# Patient Record
Sex: Female | Born: 1985 | Race: White | Hispanic: No | Smoking: Former smoker
Health system: Southern US, Community
[De-identification: ages and names within clinical notes are randomized; demographics above are authoritative.]

## PROBLEM LIST (undated history)

## (undated) DIAGNOSIS — F32A Depression, unspecified: Secondary | ICD-10-CM

## (undated) DIAGNOSIS — F319 Bipolar disorder, unspecified: Secondary | ICD-10-CM

## (undated) DIAGNOSIS — F419 Anxiety disorder, unspecified: Secondary | ICD-10-CM

## (undated) DIAGNOSIS — F909 Attention-deficit hyperactivity disorder, unspecified type: Secondary | ICD-10-CM

## (undated) DIAGNOSIS — F4310 Post-traumatic stress disorder, unspecified: Secondary | ICD-10-CM

## (undated) DIAGNOSIS — F410 Panic disorder [episodic paroxysmal anxiety] without agoraphobia: Secondary | ICD-10-CM

## (undated) DIAGNOSIS — O139 Gestational [pregnancy-induced] hypertension without significant proteinuria, unspecified trimester: Secondary | ICD-10-CM

## (undated) DIAGNOSIS — F329 Major depressive disorder, single episode, unspecified: Secondary | ICD-10-CM

## (undated) HISTORY — DX: Attention-deficit hyperactivity disorder, unspecified type: F90.9

## (undated) HISTORY — DX: Bipolar disorder, unspecified: F31.9

## (undated) HISTORY — DX: Major depressive disorder, single episode, unspecified: F32.9

## (undated) HISTORY — DX: Panic disorder (episodic paroxysmal anxiety): F41.0

## (undated) HISTORY — PX: TYMPANOSTOMY TUBE PLACEMENT: SHX32

## (undated) HISTORY — DX: Post-traumatic stress disorder, unspecified: F43.10

## (undated) HISTORY — DX: Depression, unspecified: F32.A

---
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
## 2004-10-10 ENCOUNTER — Other Ambulatory Visit: Admission: RE | Admit: 2004-10-10 | Discharge: 2004-10-10 | Payer: Self-pay | Admitting: Obstetrics and Gynecology

## 2005-09-09 ENCOUNTER — Other Ambulatory Visit: Admission: RE | Admit: 2005-09-09 | Discharge: 2005-09-09 | Payer: Self-pay | Admitting: Obstetrics and Gynecology

## 2005-12-18 ENCOUNTER — Other Ambulatory Visit: Admission: RE | Admit: 2005-12-18 | Discharge: 2005-12-18 | Payer: Self-pay | Admitting: Obstetrics & Gynecology

## 2006-12-22 ENCOUNTER — Other Ambulatory Visit: Admission: RE | Admit: 2006-12-22 | Discharge: 2006-12-22 | Payer: Self-pay | Admitting: Obstetrics & Gynecology

## 2008-04-08 ENCOUNTER — Inpatient Hospital Stay (HOSPITAL_COMMUNITY): Admission: AD | Admit: 2008-04-08 | Discharge: 2008-04-10 | Payer: Self-pay | Admitting: Obstetrics and Gynecology

## 2009-01-02 ENCOUNTER — Emergency Department (HOSPITAL_COMMUNITY): Admission: EM | Admit: 2009-01-02 | Discharge: 2009-01-02 | Payer: Self-pay | Admitting: Family Medicine

## 2009-06-13 ENCOUNTER — Emergency Department (HOSPITAL_COMMUNITY): Admission: EM | Admit: 2009-06-13 | Discharge: 2009-06-13 | Payer: Self-pay | Admitting: Emergency Medicine

## 2010-12-01 NOTE — L&D Delivery Note (Signed)
  Vanessa Mccarty, Vanessa Mccarty [[id number]]  Delivery Note At 1:30 PM a healthy female was delivered via Vaginal, Spontaneous Delivery (Presentation: LOA;  ).  APGAR: 9, 9; weight 8 lb 12.4 oz (3980 g).   Placenta delivered spontaneously ,nuchal cord x 1 reduced over head.  Anesthesia: Epidural  Episiotomy: none Lacerations: 1st degree Suture Repair: 3.0 vicryl rapide Est. Blood Loss (mL): 400cc Multiple skin tags and probable condylomas removed sharply per pt request and base treated wih silver nitrate for bleeding Sent to path.  Mom to postpartum.  Baby to nursery-stable.  [name] 07/10/2011, 1:55 PM

## 2010-12-05 ENCOUNTER — Ambulatory Visit (HOSPITAL_COMMUNITY)
Admission: RE | Admit: 2010-12-05 | Discharge: 2010-12-05 | Payer: Self-pay | Source: Home / Self Care | Attending: Obstetrics and Gynecology | Admitting: Obstetrics and Gynecology

## 2010-12-09 LAB — RPR: RPR: NONREACTIVE

## 2010-12-20 LAB — TYPE AND SCREEN: Antibody Screen: NEGATIVE

## 2010-12-20 LAB — ABO/RH

## 2011-03-09 LAB — COMPREHENSIVE METABOLIC PANEL
ALT: 17 U/L (ref 0–35)
Alkaline Phosphatase: 62 U/L (ref 39–117)
BUN: 15 mg/dL (ref 6–23)
CO2: 23 mEq/L (ref 19–32)
Calcium: 9.4 mg/dL (ref 8.4–10.5)
GFR calc non Af Amer: >60 mL/min (ref >60)
Glucose, Bld: 145 mg/dL — ABNORMAL HIGH (ref 70–99)
Sodium: 141 mEq/L (ref 135–145)
Total Protein: 7.7 g/dL (ref 6.0–8.3)

## 2011-03-09 LAB — POCT PREGNANCY, URINE: Preg Test, Ur: NEGATIVE

## 2011-03-09 LAB — CBC
HCT: 45.3 % (ref 36.0–46.0)
Hemoglobin: 15.7 g/dL — ABNORMAL HIGH (ref 12.0–15.0)
MCHC: 34.7 g/dL (ref 30.0–36.0)
RBC: 4.98 MIL/uL (ref 3.87–5.11)
RDW: 12.4 % (ref 11.5–15.5)

## 2011-03-09 LAB — LIPASE, BLOOD: Lipase: 44 U/L (ref 11–59)

## 2011-03-09 LAB — URINALYSIS, ROUTINE W REFLEX MICROSCOPIC
Ketones, ur: 15 mg/dL — AB
Nitrite: NEGATIVE
Protein, ur: 30 mg/dL — AB
Urobilinogen, UA: 1 mg/dL (ref 0.0–1.0)

## 2011-03-09 LAB — DIFFERENTIAL
Basophils Relative: 0 % (ref 0–1)
Eosinophils Absolute: 0 10*3/uL (ref 0.0–0.7)
Neutro Abs: 16.2 10*3/uL — ABNORMAL HIGH (ref 1.7–7.7)
Neutrophils Relative %: 93 % — ABNORMAL HIGH (ref 43–77)

## 2011-03-09 LAB — URINE MICROSCOPIC-ADD ON

## 2011-03-18 LAB — POCT URINALYSIS DIP (DEVICE)
Nitrite: NEGATIVE
Protein, ur: 100 mg/dL — AB
Urobilinogen, UA: 0.2 mg/dL (ref 0.0–1.0)
pH: 6.5 (ref 5.0–8.0)

## 2011-03-18 LAB — POCT PREGNANCY, URINE: Preg Test, Ur: NEGATIVE

## 2011-03-18 LAB — URINE CULTURE

## 2011-04-15 NOTE — Discharge Summary (Signed)
Vanessa Mccarty, TINDOL             ACCOUNT NO.:  [id number]   MEDICAL RECORD NO.:  [id number]          PATIENT TYPE:  INP   LOCATION:  9130                          FACILITY:  [facility]   PHYSICIAN:  Malachi Pro. Ambrose Mantle, M.D. DATE OF BIRTH:  08-Jul-1986   DATE OF ADMISSION:  04/08/2008  DATE OF DISCHARGE:  04/10/2008                               DISCHARGE SUMMARY   A 25 year old white, married female, para 0-0-1-0, gravida 2, EDC Apr 16, 2008, admitted with premature rupture of the membranes.  Blood group  and type O positive, negative antibody, nonreactive serology, rubella  immune, hepatitis B surface antigen negative, HIV negative, GC and  chlamydia negative, AFP negative, 1-hour Glucola within normal limits  per the patient's report.  Group B strep negative.  Cystic fibrosis  negative.  First trimester screen negative.  Vaginal ultrasound on  September 16, 2007 crown-rump length 2.7 cm, 9 weeks 4 days, [facility] Apr 16, 2008.  Ultrasound on November 23, 2007, average gestational age [redacted] weeks  6 days, [facility] Apr 12, 2008.  Prenatal care was uncomplicated.  At  approximately 1 a.m. on the day of admission, the patient had  spontaneous rupture of membranes at home with clear fluid.  She came to  the maternity admission unit, Pitocin was begun after she was admitted.   PAST MEDICAL HISTORY:  Allergies, possibly to SULFA, questionable  reaction.   ILLNESSES:  Urinary tract infections, anxiety, depression, and  migraines.   OPERATIONS:  Ear surgeries.   FAMILY HISTORY:  Mother with high blood pressure, paternal grandfather  with leukemia, maternal grandfather with diabetes, and two cousins with  mental retardation.   OBSTETRICAL HISTORY:  In 2006, a spontaneous abortion that was not  confirmed.   On admission, the patient's blood pressures were borderline at 151/91,  141/79, 139/92, pulse 76, respirations 20, and temperature 98.2.  Heart  and lungs were normal.  The abdomen was soft.  Fundal  height was  consistent with dates.  Fetal heart tones were normal.  Contractions  were every 2 minutes on 6 mU/min of Pitocin.  Cervix was 3 cm, 80%  vertex at a -2.  In spite of the fact that the patient had ruptured  membranes, it had been confirmed she still had a forebag and this was  ruptured producing a slight amount of fluid.  After the rupture of  membranes, the patient became very uncomfortable.  She made progress and  received an epidural.  After full dilatation, she pushed well and showed  approximately 7 cm of caput, but at that point, she made no more  progress and requested assistance.  With the Mushroom self-controlled  vacuum applied with one contraction and no pop offs, a living female  infant 8 pounds 2 ounces with Apgars of 9 at one and 9 at five minutes  was delivered OA over an intact perineum with bilateral vaginal and  labial lacerations by Dr. Ambrose Mantle.  Placenta was intact.  There was one  loop of nuchal cord.  Bilateral lacerations were sutured with 3-0 Vicryl  under local block.  Blood loss was  about 400 mL.  Postpartum, the  patient's mother who has had extensive experience as a nurse and had a  relative who apparently had severe problem with preeclampsia, requested  the patient go on magnesium sulfate.  I informed the patient that she  had PIH, but not preeclampsia, but would be glad to treat her with mag  sulfate if that is what the family requested.  She was begun on  magnesium sulfate in spite of the fact that urine had no protein and her  platelet count and liver function tests were normal.  On the morning of  the first postpartum day, the patient was afebrile.  Her blood pressures  had improved.  She had no headache.  PIH labs were again normal.  There  was significant bulbar edema bilaterally with some ecchymosis.  On the  second postpartum day, the patient had been off magnesium sulfate for  approximately 16 hours.  Her blood pressures have remained less  than  150/100.  She had no headache or epigastric pain and she was considered  a candidate for discharge.   LABORATORY DATA:  Initial hemoglobin was 12.6, hematocrit 36.4, white  count 20,100, and platelet count 213,000.  Followup hemoglobin 11.6 and  platelet count 225,000.  Potassium was 3.5 and 3.3, BUN was 3 and less  than 1.  SGOT and SGPT were 20 and 10 on the first day and 26 and 12 on  the second day.  LDH was 173 and 262, thought to be representative of  hemolysis.  Uric acids were 4.7 and 4.8.  Cath urine protein was  negative.  RPR was nonreactive.   FINAL DIAGNOSES:  Intrauterine pregnancy at 38 weeks and 6 days,  delivered OA with vacuum assistance and pregnancy-induced high blood  pressure.   OPERATION:  Vacuum-assisted vaginal delivery OA, repair of bilateral  labial and vaginal lacerations.   FINAL CONDITION:  Improved.   DISCHARGE INSTRUCTIONS:  Instructions include our regular discharge  instruction booklet.  Percocet 5/325, 36 tablets one every 4-6 hours as  needed for pain.  The patient is advised to avoid nonsteroidal anti-  inflammatory drugs.  She is advised to take her blood pressure twice  daily.  If the blood pressure is above 150/100 or if she begins to have  severe headache or epigastric pain, she is to call our office.  She is  to return in 2 weeks for followup examination.      Malachi Pro. Ambrose Mantle, M.D.  Electronically Signed     TFH/MEDQ  D:  04/10/2008  T:  04/10/2008  Job:  [id number]

## 2011-06-22 ENCOUNTER — Encounter (HOSPITAL_COMMUNITY): Payer: Self-pay | Admitting: *Deleted

## 2011-06-22 ENCOUNTER — Inpatient Hospital Stay (HOSPITAL_COMMUNITY)
Admission: AD | Admit: 2011-06-22 | Discharge: 2011-06-22 | Disposition: A | Discharge status: Home / Self Care | Payer: PRIVATE HEALTH INSURANCE | Source: Ambulatory Visit | Attending: Obstetrics and Gynecology | Admitting: Obstetrics and Gynecology

## 2011-06-22 DIAGNOSIS — O4700 False labor before 37 completed weeks of gestation, unspecified trimester: Secondary | ICD-10-CM | POA: Insufficient documentation

## 2011-06-22 HISTORY — DX: Gestational (pregnancy-induced) hypertension without significant proteinuria, unspecified trimester: O13.9

## 2011-06-22 NOTE — Progress Notes (Signed)
Notified of no change in SVE. Orders for discharge home. F/u in office this week

## 2011-06-22 NOTE — Progress Notes (Signed)
"  At 0300, I leaked clear fluid when I woke up to use the BR.  When I got up to use the BR I was already wet, and I still had to pee so I continued to go to the BR.  I haven't leaked any more since then, but I started having at 0320 every 3-6 mins.

## 2011-06-22 NOTE — ED Notes (Signed)
Report received from [facility] agree with initial assessment.

## 2011-06-22 NOTE — Progress Notes (Signed)
G2P1 at 37wks. Awoke at 0300 and panties wet but denies leaking fld since then. No bleeding. Contractions since 0320

## 2011-07-09 ENCOUNTER — Other Ambulatory Visit: Payer: Self-pay | Admitting: Obstetrics and Gynecology

## 2011-07-10 ENCOUNTER — Other Ambulatory Visit: Payer: Self-pay | Admitting: Obstetrics and Gynecology

## 2011-07-10 ENCOUNTER — Inpatient Hospital Stay (HOSPITAL_COMMUNITY): Payer: PRIVATE HEALTH INSURANCE | Admitting: Anesthesiology

## 2011-07-10 ENCOUNTER — Encounter (HOSPITAL_COMMUNITY): Payer: Self-pay | Admitting: Anesthesiology

## 2011-07-10 ENCOUNTER — Inpatient Hospital Stay (HOSPITAL_COMMUNITY)
Admission: RE | Admit: 2011-07-10 | Discharge: 2011-07-12 | DRG: 774 | Disposition: A | Discharge status: Home / Self Care | Payer: PRIVATE HEALTH INSURANCE | Source: Ambulatory Visit | Attending: Obstetrics and Gynecology | Admitting: Obstetrics and Gynecology

## 2011-07-10 ENCOUNTER — Encounter (HOSPITAL_COMMUNITY): Payer: Self-pay

## 2011-07-10 DIAGNOSIS — L909 Atrophic disorder of skin, unspecified: Secondary | ICD-10-CM | POA: Diagnosis present

## 2011-07-10 DIAGNOSIS — O139 Gestational [pregnancy-induced] hypertension without significant proteinuria, unspecified trimester: Secondary | ICD-10-CM | POA: Diagnosis present

## 2011-07-10 DIAGNOSIS — O98519 Other viral diseases complicating pregnancy, unspecified trimester: Secondary | ICD-10-CM | POA: Diagnosis present

## 2011-07-10 DIAGNOSIS — A630 Anogenital (venereal) warts: Secondary | ICD-10-CM | POA: Diagnosis present

## 2011-07-10 HISTORY — DX: Anxiety disorder, unspecified: F41.9

## 2011-07-10 LAB — CBC
HCT: 34.6 % — ABNORMAL LOW (ref 36.0–46.0)
Hemoglobin: 11.6 g/dL — ABNORMAL LOW (ref 12.0–15.0)
MCH: 30.9 pg (ref 26.0–34.0)
MCHC: 34.5 g/dL (ref 30.0–36.0)
Platelets: 181 10*3/uL (ref 150–400)
Platelets: 167 10*3/uL (ref 150–400)
RBC: 3.76 MIL/uL — ABNORMAL LOW (ref 3.87–5.11)
RDW: 13.5 % (ref 11.5–15.5)
WBC: 19.5 10*3/uL — ABNORMAL HIGH (ref 4.0–10.5)

## 2011-07-10 LAB — URINALYSIS, DIPSTICK ONLY
Glucose, UA: NEGATIVE mg/dL
Leukocytes, UA: NEGATIVE
Nitrite: NEGATIVE
Protein, ur: NEGATIVE mg/dL
Urobilinogen, UA: 0.2 mg/dL (ref 0.0–1.0)

## 2011-07-10 LAB — COMPREHENSIVE METABOLIC PANEL
ALT: 8 U/L (ref 0–35)
AST: 20 U/L (ref 0–37)
Albumin: 2.7 g/dL — ABNORMAL LOW (ref 3.5–5.2)
Alkaline Phosphatase: 257 U/L — ABNORMAL HIGH (ref 39–117)
BUN: <3 mg/dL — ABNORMAL LOW (ref 6–23)
Chloride: 102 mEq/L (ref 96–112)
Potassium: 3.1 mEq/L — ABNORMAL LOW (ref 3.5–5.1)
Sodium: 134 mEq/L — ABNORMAL LOW (ref 135–145)
Total Bilirubin: 0.6 mg/dL (ref 0.3–1.2)

## 2011-07-10 LAB — RPR: RPR Ser Ql: NONREACTIVE

## 2011-07-10 MED ORDER — DIPHENHYDRAMINE HCL 50 MG/ML IJ SOLN: 12.5000 mg | INTRAMUSCULAR | Status: DC | PRN

## 2011-07-10 MED ORDER — OXYTOCIN 20 UNITS IN LACTATED RINGERS INFUSION - SIMPLE: 125.0000 mL/h | INTRAVENOUS | Status: DC

## 2011-07-10 MED ORDER — OXYTOCIN 20 UNITS IN LACTATED RINGERS INFUSION - SIMPLE
1.0000 m[IU]/min | INTRAVENOUS | Status: DC
  Administered 2011-07-10: 2 m[IU]/min via INTRAVENOUS
  Administered 2011-07-10: 6 m[IU]/min via INTRAVENOUS
  Administered 2011-07-10: 4 m[IU]/min via INTRAVENOUS

## 2011-07-10 MED ORDER — EPHEDRINE 5 MG/ML INJ
10.0000 mg | INTRAVENOUS | Status: DC | PRN
  Filled 2011-07-10: qty 4

## 2011-07-10 MED ORDER — CITRIC ACID-SODIUM CITRATE 334-500 MG/5ML PO SOLN: 30.0000 mL | ORAL | Status: DC | PRN

## 2011-07-10 MED ORDER — OXYTOCIN BOLUS FROM INFUSION
500.0000 mL | Freq: Once | INTRAVENOUS | Status: DC
  Filled 2011-07-10: qty 500
  Filled 2011-07-10: qty 1000

## 2011-07-10 MED ORDER — ONDANSETRON HCL 4 MG/2ML IJ SOLN: 4.0000 mg | Freq: Four times a day (QID) | INTRAMUSCULAR | Status: DC | PRN

## 2011-07-10 MED ORDER — PRENATAL PLUS 27-1 MG PO TABS
1.0000 | ORAL_TABLET | Freq: Every day | ORAL | Status: DC
  Administered 2011-07-11 – 2011-07-12 (×2): 1 via ORAL
  Filled 2011-07-10 (×2): qty 1

## 2011-07-10 MED ORDER — LACTATED RINGERS IV SOLN
500.0000 mL | INTRAVENOUS | Status: DC | PRN
Start: 2011-07-10 — End: 2011-07-10

## 2011-07-10 MED ORDER — TERBUTALINE SULFATE 1 MG/ML IJ SOLN: 0.2500 mg | Freq: Once | INTRAMUSCULAR | Status: DC | PRN

## 2011-07-10 MED ORDER — WITCH HAZEL-GLYCERIN EX PADS
1.0000 "application " | MEDICATED_PAD | CUTANEOUS | Status: DC | PRN
  Administered 2011-07-11: 1 via TOPICAL

## 2011-07-10 MED ORDER — SIMETHICONE 80 MG PO CHEW: 80.0000 mg | CHEWABLE_TABLET | ORAL | Status: DC | PRN

## 2011-07-10 MED ORDER — IBUPROFEN 600 MG PO TABS: 600.0000 mg | ORAL_TABLET | Freq: Four times a day (QID) | ORAL | Status: DC | PRN

## 2011-07-10 MED ORDER — ZOLPIDEM TARTRATE 5 MG PO TABS: 5.0000 mg | ORAL_TABLET | Freq: Every evening | ORAL | Status: DC | PRN

## 2011-07-10 MED ORDER — LACTATED RINGERS IV SOLN: 500.0000 mL | INTRAVENOUS | Status: DC | PRN

## 2011-07-10 MED ORDER — OXYCODONE-ACETAMINOPHEN 5-325 MG PO TABS: 1.0000 | ORAL_TABLET | ORAL | Status: DC | PRN

## 2011-07-10 MED ORDER — PHENYLEPHRINE 40 MCG/ML (10ML) SYRINGE FOR IV PUSH (FOR BLOOD PRESSURE SUPPORT)
80.0000 ug | PREFILLED_SYRINGE | INTRAVENOUS | Status: DC | PRN
  Filled 2011-07-10 (×3): qty 5

## 2011-07-10 MED ORDER — LACTATED RINGERS IV SOLN: INTRAVENOUS | Status: DC

## 2011-07-10 MED ORDER — FLEET ENEMA 7-19 GM/118ML RE ENEM: 1.0000 | ENEMA | RECTAL | Status: DC | PRN

## 2011-07-10 MED ORDER — LIDOCAINE HCL (PF) 1 % IJ SOLN: 30.0000 mL | INTRAMUSCULAR | Status: DC | PRN

## 2011-07-10 MED ORDER — BENZOCAINE-MENTHOL 20-0.5 % EX AERO: 1.0000 "application " | INHALATION_SPRAY | CUTANEOUS | Status: DC | PRN

## 2011-07-10 MED ORDER — DIBUCAINE 1 % RE OINT: 1.0000 "application " | TOPICAL_OINTMENT | RECTAL | Status: DC | PRN

## 2011-07-10 MED ORDER — OXYCODONE-ACETAMINOPHEN 5-325 MG PO TABS: 2.0000 | ORAL_TABLET | ORAL | Status: DC | PRN

## 2011-07-10 MED ORDER — ACETAMINOPHEN 325 MG PO TABS: 650.0000 mg | ORAL_TABLET | ORAL | Status: DC | PRN

## 2011-07-10 MED ORDER — DIPHENHYDRAMINE HCL 25 MG PO CAPS: 25.0000 mg | ORAL_CAPSULE | Freq: Four times a day (QID) | ORAL | Status: DC | PRN

## 2011-07-10 MED ORDER — LIDOCAINE HCL (PF) 1 % IJ SOLN
30.0000 mL | INTRAMUSCULAR | Status: DC | PRN
  Filled 2011-07-10 (×2): qty 30

## 2011-07-10 MED ORDER — LANOLIN HYDROUS EX OINT: TOPICAL_OINTMENT | CUTANEOUS | Status: DC | PRN

## 2011-07-10 MED ORDER — FENTANYL 2.5 MCG/ML BUPIVACAINE 1/10 % EPIDURAL INFUSION (WH - ANES)
14.0000 mL/h | INTRAMUSCULAR | Status: DC
  Administered 2011-07-10: 14 mL/h via EPIDURAL
  Filled 2011-07-10 (×2): qty 60

## 2011-07-10 MED ORDER — EPHEDRINE 5 MG/ML INJ
10.0000 mg | INTRAVENOUS | Status: DC | PRN
  Filled 2011-07-10 (×3): qty 4

## 2011-07-10 MED ORDER — BENZOCAINE-MENTHOL 20-0.5 % EX AERO
INHALATION_SPRAY | CUTANEOUS | Status: AC
  Filled 2011-07-10: qty 56

## 2011-07-10 MED ORDER — PHENYLEPHRINE 40 MCG/ML (10ML) SYRINGE FOR IV PUSH (FOR BLOOD PRESSURE SUPPORT)
80.0000 ug | PREFILLED_SYRINGE | INTRAVENOUS | Status: DC | PRN
  Filled 2011-07-10: qty 5

## 2011-07-10 MED ORDER — SENNOSIDES-DOCUSATE SODIUM 8.6-50 MG PO TABS
2.0000 | ORAL_TABLET | Freq: Every day | ORAL | Status: DC
  Administered 2011-07-10 – 2011-07-11 (×2): 2 via ORAL

## 2011-07-10 MED ORDER — ONDANSETRON HCL 4 MG PO TABS: 4.0000 mg | ORAL_TABLET | ORAL | Status: DC | PRN

## 2011-07-10 MED ORDER — TETANUS-DIPHTH-ACELL PERTUSSIS 5-2.5-18.5 LF-MCG/0.5 IM SUSP: 0.5000 mL | Freq: Once | INTRAMUSCULAR | Status: DC

## 2011-07-10 MED ORDER — IBUPROFEN 600 MG PO TABS
600.0000 mg | ORAL_TABLET | Freq: Four times a day (QID) | ORAL | Status: DC
  Administered 2011-07-10 – 2011-07-12 (×8): 600 mg via ORAL
  Filled 2011-07-10 (×8): qty 1

## 2011-07-10 MED ORDER — ONDANSETRON HCL 4 MG/2ML IJ SOLN: 4.0000 mg | INTRAMUSCULAR | Status: DC | PRN

## 2011-07-10 MED ORDER — OXYTOCIN BOLUS FROM INFUSION: 500.0000 mL | Freq: Once | INTRAVENOUS | Status: DC

## 2011-07-10 MED ORDER — LACTATED RINGERS IV SOLN: 500.0000 mL | Freq: Once | INTRAVENOUS | Status: DC

## 2011-07-10 NOTE — H&P (Signed)
Vanessa Mccarty is a 25 y.o. female G3P1011 at 64 5/7 weeks (EDD 07/12/11 by 6 week [place])  presenting for induction of labor given term status and favorable cervix. Prenatal care essentially uncomplicated.  Occasional BP at 120/90, always normalized with rest.  No PIH sx or proteinuria. History Past ObHx SAB 2006 NSVD 2009 8#2oz  Past GynHX none  Past medical HX ADHD Anxiety/Depression--stable off meds for pregnancy (Zoloft in past) Migraines H/o pyelonephritis  Past Surgical History  Procedure Date  . Tympanostomy tube placement     tube fell out btwn 8-10 yrs and last one removed at 70yrs   Family History:in pt prenatals Social History:  reports that she quit smoking about 11 months ago. Her smoking use included Cigarettes. She has a 2.5 pack-year smoking history. She does not have any smokeless tobacco history on file. She reports that she drinks alcohol. She reports that she does not use illicit drugs.  Review of Systems  Gastrointestinal: Negative for heartburn.  Neurological: Negative for headaches.      Height 5\' 4"  (1.626 m), weight 69.854 kg (154 lb). Maternal Exam:  Uterine Assessment: Contraction strength is mild.  Contraction frequency is irregular.   Abdomen: Patient reports no abdominal tenderness. Fundal height is 39.   Estimated fetal weight is 8 1/2 lbs.   Fetal presentation: vertex  Introitus: Vulva is positive for lesion. Normal vagina.    Physical Exam  Constitutional: She appears well-developed.  Cardiovascular: Normal rate and regular rhythm.   Respiratory: Effort normal and breath sounds normal.  GI: Soft. Bowel sounds are normal.  Genitourinary: Vulva exhibits lesion.  Neurological: She is alert.  Psychiatric: Her behavior is normal.  --skin tags (multiple) and some possible HPV  Prenatal labs: ABO, Rh:  O positive Antibody: Negative (01/20 0000) Rubella:  Immune RPR: Nonreactive (01/09 0000)  HBsAg: Negative (01/09 0000)  HIV: Non-reactive  (08/09 0000)  GBS: Negative (07/09 0000)  First trimester screen WNL AFP WNL One hour glucola  126 GC-neg Chlam-neg Assessment/Plan: Pt admitted for induction of labor given term status and favorable cervix.  Some BP in the 90 diastolics, but no sx preeclampsia.  Will observe, and if increase throughout the day check PIH Labs and UA for protein.  On pitocin per protocol and plans epidural.   [name] 07/10/2011, 8:32 AM

## 2011-07-10 NOTE — Progress Notes (Signed)
Lactation brochure reviewed with mom, advised of community resources for breastfeeding mom, advised of outpatient services, if needed. Basic teaching reviewed.

## 2011-07-10 NOTE — Anesthesia Procedure Notes (Addendum)
Epidural Patient location during procedure: OB Start time: 07/10/2011 12:51 PM  Staffing Anesthesiologist: Jiles Garter  Preanesthetic Checklist Completed: patient identified, site marked, surgical consent, pre-op evaluation, timeout performed, IV checked, risks and benefits discussed and monitors and equipment checked  Epidural Patient position: sitting Prep: site prepped and draped and DuraPrep Patient monitoring: continuous pulse ox and blood pressure Approach: midline Injection technique: LOR air  Needle:  Needle type: Tuohy  Needle gauge: 17 G Needle length: 9 cm Needle insertion depth: 5 cm cm Catheter type: closed end flexible Catheter size: 19 Gauge Catheter at skin depth: 10 cm Test dose: negative  Assessment Events: blood not aspirated, injection not painful, no injection resistance, negative IV test and no paresthesia  Additional Notes Dosing of Epidural: 1st dose, Through needle...... 5mg  Marcaine 2nd dose, through catheter.... epi 1:200K + Xylocaine 40 mg 3rd dose, through catheter...Marland KitchenMarland Kitchenepi 1:200K + Xylocaine 60 mg Each dose occurred after waiting 3 min,patient was free of IV sx; and patient exhibits no evidence of SA injection  Patient is more comfortable after epidural dosed. Please see RN's note for documentation of vital signs,and FHR which are stable.

## 2011-07-10 NOTE — Progress Notes (Signed)
Post Partum Day0 Subjective: Feels well, no HA or pain  Objective: Blood pressure 143/97, pulse 92, temperature 98.1 F (36.7 C), temperature source Axillary, resp. rate 16, height 5\' 4"  (1.626 m), weight 69.854 kg (154 lb), SpO2 100.00%, unknown if currently breastfeeding.  Physical Exam:  General: alert Lochia: appropriate Uterine Fundus: firm     Basename 07/10/11 1534 07/10/11 0800  HGB 12.0 11.6*  HCT 34.6* 33.6*    Assessment/Plan: Pt with increased BP postpartum. 140/90's  Labs WNL and no proteinuria.  Will continue to follow closely.   LOS: 0 days   Porsche Noguchi W 07/10/2011, 6:50 PM

## 2011-07-10 NOTE — Anesthesia Preprocedure Evaluation (Signed)

## 2011-07-11 LAB — CBC
HCT: 32.7 % — ABNORMAL LOW (ref 36.0–46.0)
Platelets: 167 10*3/uL (ref 150–400)
RDW: 13.5 % (ref 11.5–15.5)
WBC: 15.2 10*3/uL — ABNORMAL HIGH (ref 4.0–10.5)

## 2011-07-11 NOTE — Progress Notes (Signed)
SW referral received to assess hx of depression.  Pt told SW that her depression symptoms have been treated off and on since she was 25 years old.  Pt described her depression as seasonal.  She has not taken medication (Zoloft) since 5/11.  She reports being able to cope well with out medication during pregnancy.  She has good family support.  She denies hx of SI.  Pt is aware of PP depression risk and plans to contact the doctor if symptoms arise.  Pt was appropriate and appreciative of PP depression literature given.  SW will assist further if needed. 

## 2011-07-11 NOTE — Anesthesia Postprocedure Evaluation (Signed)
  Anesthesia Post-op Note  Patient: Vanessa Mccarty  Procedure(s) Performed: * No procedures listed *  Patient Location: PACU and Mother/Baby  Anesthesia Type: Epidural  Level of Consciousness: awake, alert  and oriented  Airway and Oxygen Therapy: Patient Spontanous Breathing  Post-op Pain: none  Post-op Assessment: Post-op Vital signs reviewed  Post-op Vital Signs: Reviewed and stable  Complications: No apparent anesthesia complications

## 2011-07-11 NOTE — Progress Notes (Signed)
Post Partum Day1 Subjective: no complaints.  Pt feels no HA or PIH sx.  Nml lochia, pain controlled.  Objective: Blood pressure 165/95, pulse 74, temperature 98 F (36.7 C), temperature source Oral, resp. rate 18, height 5\' 4"  (1.626 m), weight 69.854 kg (154 lb), SpO2 98.00%, unknown if currently breastfeeding.  Physical Exam:  General: alert Lochia: appropriate Uterine Fundus: firm  PIH labs WNL last pm and urine negative for protein  Basename 07/11/11 0530 07/10/11 1534  HGB 11.5* 12.0  HCT 32.7* 34.6*    Assessment/Plan: Plan for discharge tomorrow Pt with elevated BP to 130-160/80-90's, but no evidence of preeclampsia.  Advised her if diastolics consistently in th 100's would Consider po meds.   LOS: 1 day   Vanessa Mccarty W 07/11/2011, 11:10 AM

## 2011-07-12 MED ORDER — LABETALOL HCL 100 MG PO TABS
100.0000 mg | ORAL_TABLET | Freq: Two times a day (BID) | ORAL | Status: DC
  Administered 2011-07-12: 100 mg via ORAL
  Filled 2011-07-12 (×3): qty 1

## 2011-07-12 MED ORDER — LABETALOL HCL 100 MG PO TABS: 100.0000 mg | ORAL_TABLET | Freq: Two times a day (BID) | ORAL | Status: DC

## 2011-07-12 MED ORDER — IBUPROFEN 600 MG PO TABS: 600.0000 mg | ORAL_TABLET | Freq: Four times a day (QID) | ORAL | Status: AC | PRN

## 2011-07-12 NOTE — Progress Notes (Signed)
Reviewed engorgement tx if needed. Per mom nipples alittle tender , shells and hand pump given , Reviewed basics with latching .

## 2011-07-12 NOTE — Discharge Summary (Signed)
NAMEDARSHANA, CURNUTT NO.:  [id number]  MEDICAL RECORD NO.:  [id number]  LOCATION:  9134                          FACILITY:  [facility]  PHYSICIAN:  Malachi Pro. Ambrose Mantle, M.D. DATE OF BIRTH:  1986/05/05  DATE OF ADMISSION:  07/10/2011 DATE OF DISCHARGE:  07/12/2011                              DISCHARGE SUMMARY   This is a 25 year old white female, para 1-0-1-1, gravida 3, EDC July 12, 2011 by 6-week ultrasound presented for induction of labor given her term status and favorable cervix.  Prenatal course was essentially uncomplicated.  Occasional blood pressure at 120/90, always normalized with rest, no PIH symptoms or proteinuria.  Blood group and type O+, negative antibody, rubella immune, RPR nonreactive, hepatitis B surface antigen negative, HIV negative.  Group B strep negative.  First trimester screen normal.  AFP normal, 1-hour Glucola 126, GC and Chlamydia negative.  After admission to the hospital, the patient was placed on Pitocin.  She progressed to full dilatation and delivered a spontaneous vaginal delivery, LOA, 8 pounds, 12.4 ounces infant with Apgars of 9 at 1 and 9 at 5 minutes.  Placenta was delivered intact. Nuchal cord x1 was reduced over the head.  Dr. Senaida Ores was in attendance.  Anesthesia was epidural.  No episiotomy.  First-degree laceration was sutured with 3-0 Vicryl Rapide.  Blood loss about 400 mL. Multiple skin tags and probable condyloma was removed sharply per the patient's request and base treated with silver nitrate for bleeding and sent to Pathology.  The patient's initial hemoglobin was 12.0, hematocrit 34.6, white count 19,500.  Followup hemoglobin was 11.5, hematocrit 32.7, both platelet count was 167,000.  Comprehensive metabolic profile showed SGOT and SGPT to be 20 and 8 respectively. Uric acid was 4.4.  The patient's postpartum course was complicated somewhat by elevated blood pressures.  As stated, the [facility] panel was normal as  well as a cath urine for protein, which was negative.  The patient continued to have somewhat elevated blood pressures and on the second postpartum day when I was ready to discharge, her blood pressure 162/113, so I began her on labetalol 100 mg twice a day and at this time, 4:30 p.m. on the second postpartum day, the patient's blood pressures have been 80s to low 90s diastolic and 140s to 150 systolic.  FINAL DIAGNOSES: 1. Intrauterine pregnancy at term, delivered LOA. 2. Gestational hypertension.  OPERATIONS: 1. Spontaneous delivery LOA. 2. Repair of first-degree laceration.  MEDICATIONS:  Include: 1. Advil 600 mg 1 tablet every 6 hours as needed for pain, 30 tablets     with one refill. 2. Labetalol 100 mg tablets 1 tablet twice a day, 60 tablets and one     refill.  The patient is advised to take her blood pressure a couple of times a day and report any blood pressure greater than 150/100.  She is to return to the office in 1 week for followup examination.     Malachi Pro. Ambrose Mantle, M.D.     TFH/MEDQ  D:  07/12/2011  T:  07/12/2011  Job:  [id number]

## 2011-07-12 NOTE — Progress Notes (Signed)
#  2 afebrile BP consistently elevated and this AM 162/113. Recheck 15 minutes later 156/82. Preeclampsia labs were normal  No sx Will begin labetolol 100 mg bid.

## 2011-07-12 NOTE — Progress Notes (Signed)
  BP stable and will discharge.

## 2011-12-24 IMAGING — US US OB COMP LESS 14 WK
1 series · 13 of 28 positions shown · non-contrast
Comparison: none

[Series 1: us ob comp less 14 wks · 28 acquisitions, 13 frames shown]
[im 2/28]
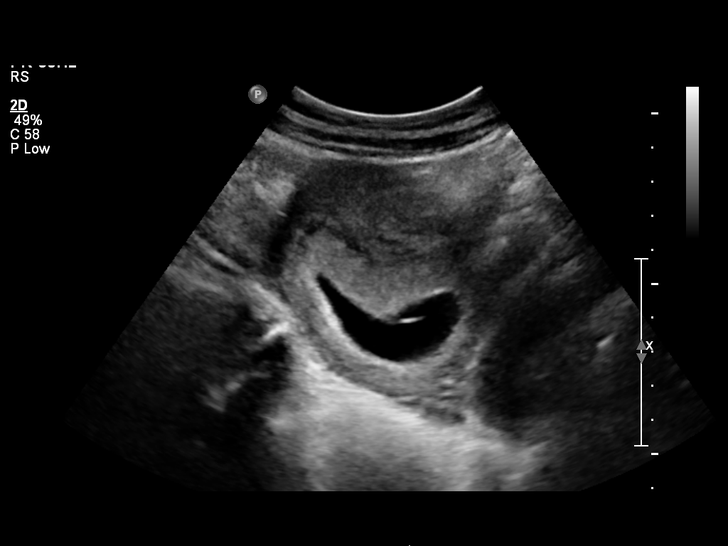
[im 4/28]
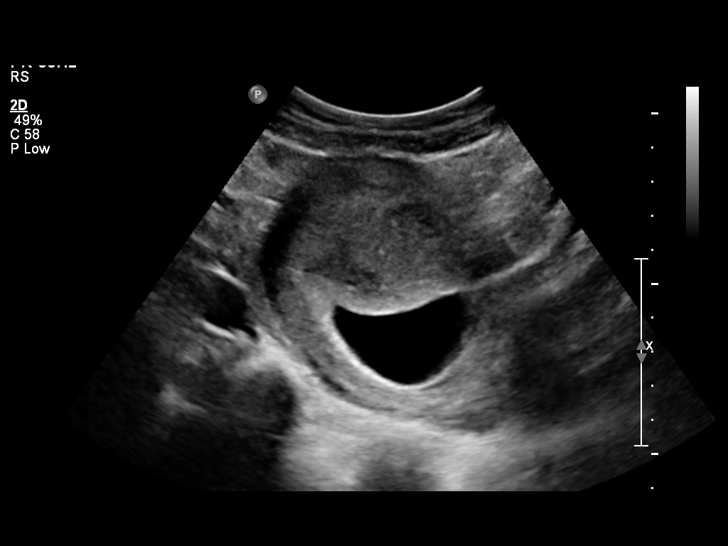
[im 6/28]
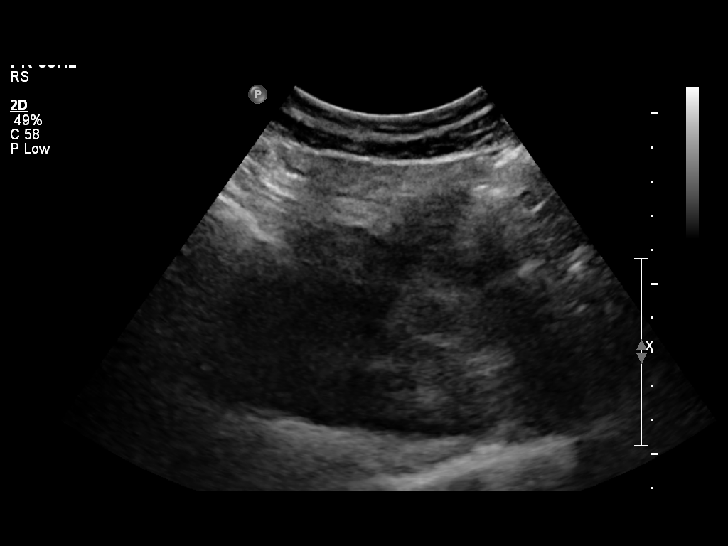
[im 8/28]
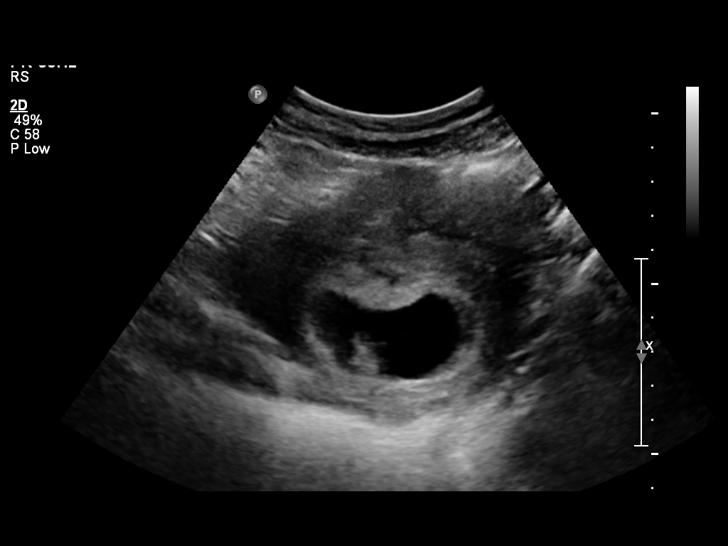
[im 10/28]
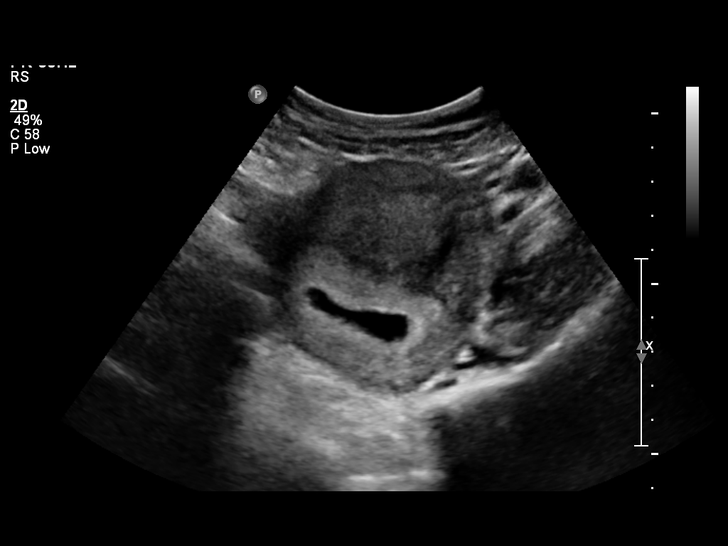
[im 12/28]
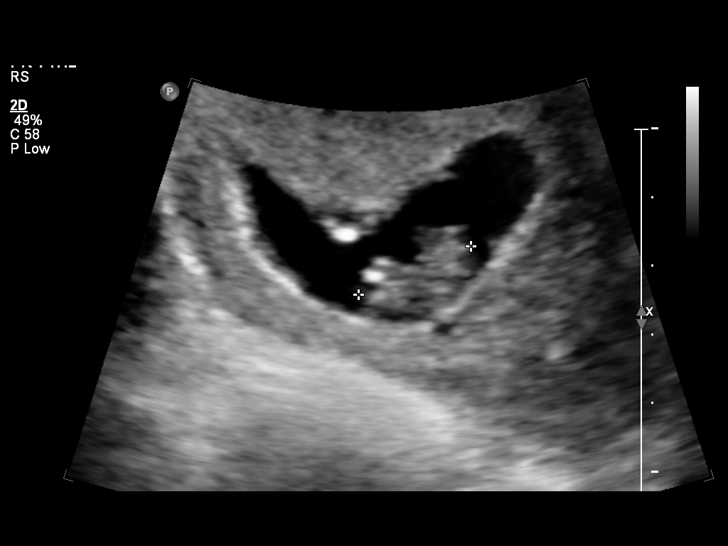
[im 15/28]
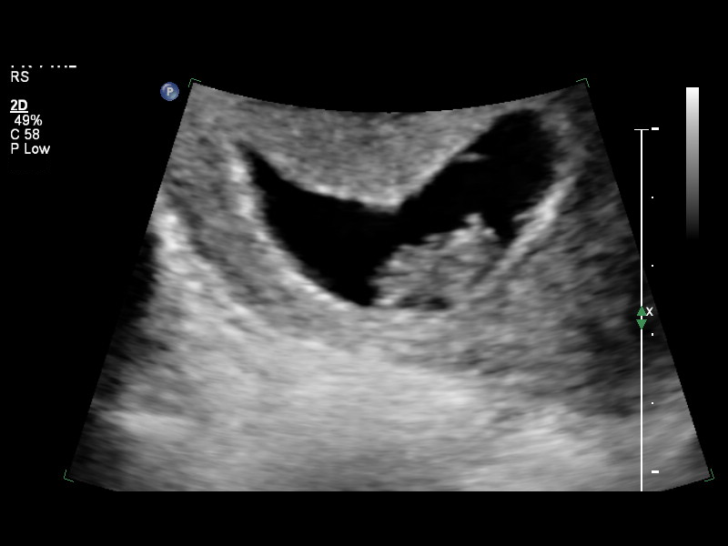
[im 17/28]
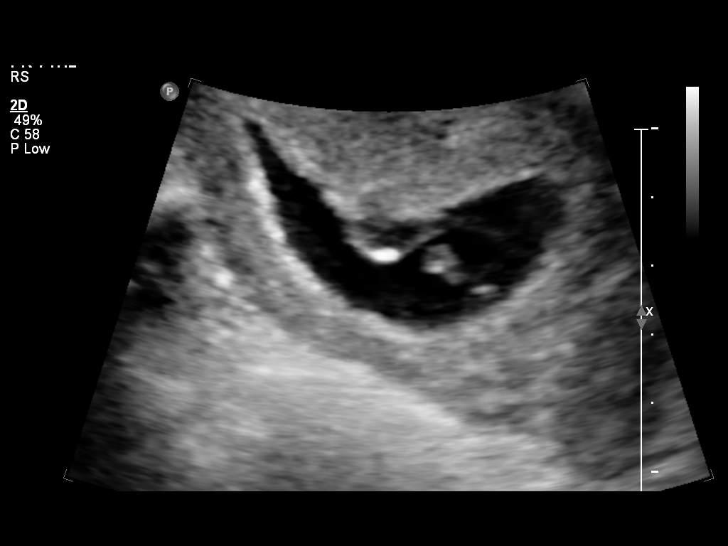
[im 19/28]
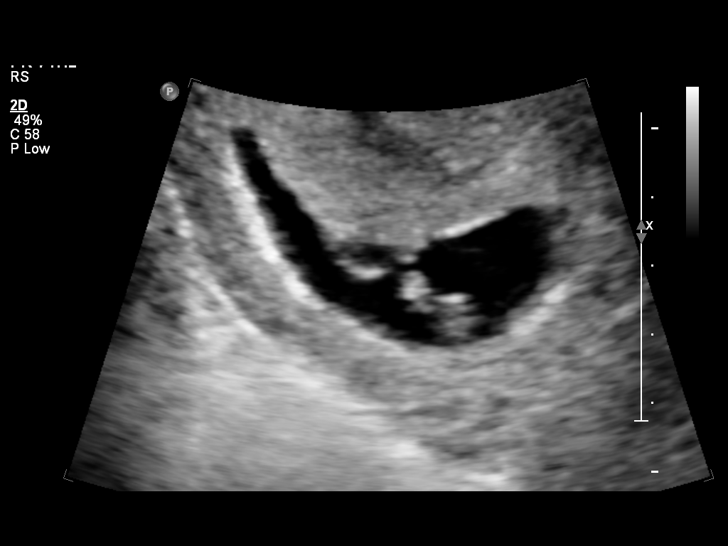
[im 21/28]
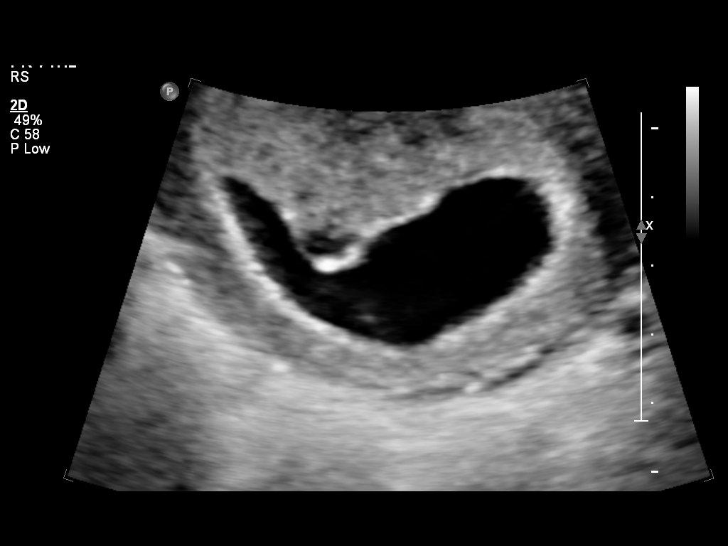
[im 23/28]
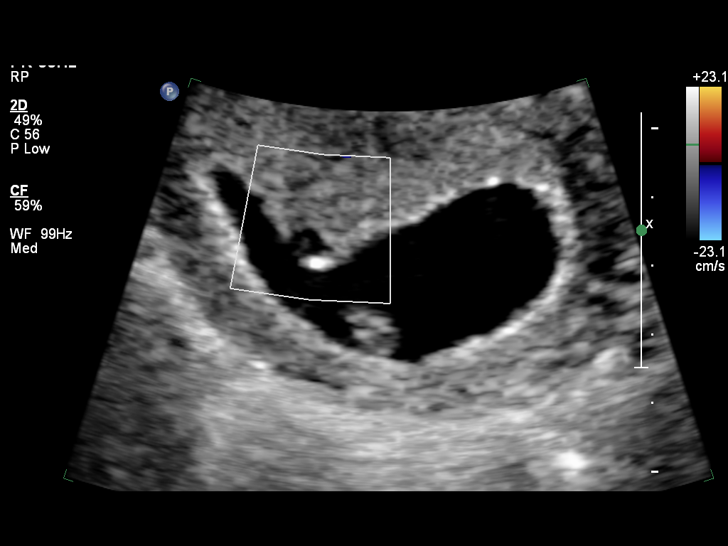
[im 25/28]
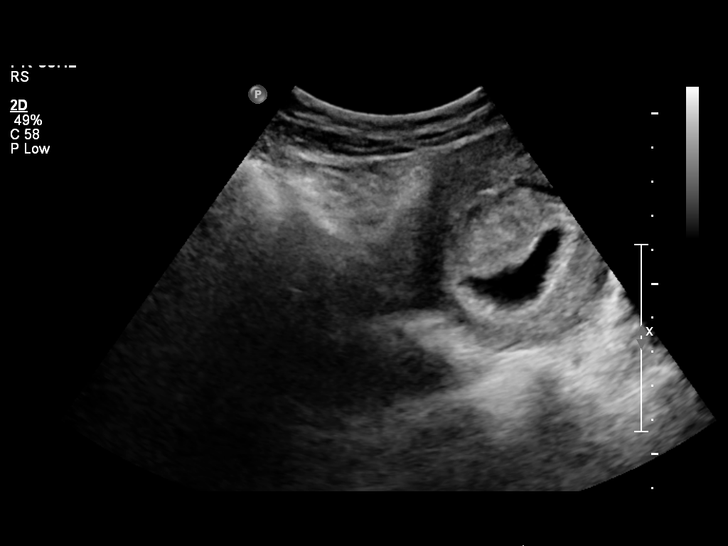
[im 27/28]
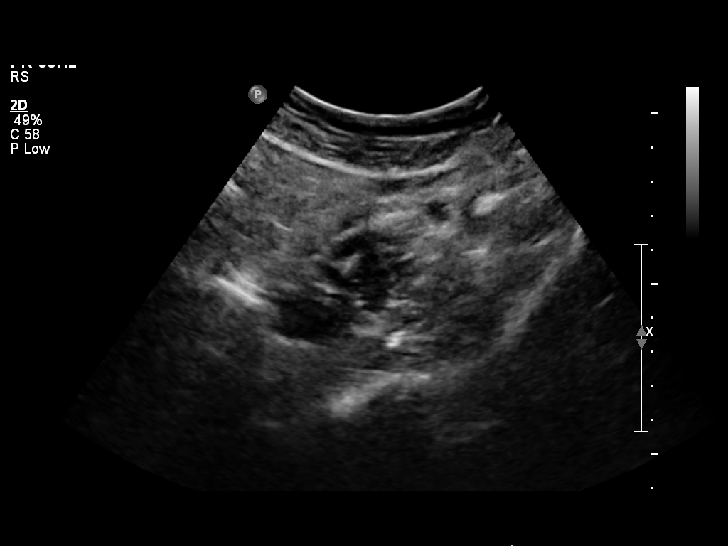

[13 of 28 positions shown; findings below may reference images not displayed]

OBSTETRICS REPORT
                      (Signed Final 12/05/2010 [DATE])

 Order#:         _O
Procedures

 US OB COMP LESS 14 WKS                                76801.0
Indications

 No fetal cardiac activity detected;  assess viability
Fetal Evaluation

 Fetal Heart Rate:  167                          bpm
 Cardiac Activity:  Observed
Biometry

 CRL:     19.2  mm     G. Age:  8w 2d                  EDD:    07/15/11
Gestational Age

 Best:          8w 5d      Det. By:  Previous Ultrasound      EDD:   07/12/11
                                     (11/18/10)
Cervix Uterus Adnexa

 Cervix:       Normal appearance by transabdominal scan.
 Uterus:       .93x.96cm cystic area associated with the early
               placenta anterior and adjacent to pci, not a cord cyst
 Left Ovary:    Not visualized.
 Right Ovary:   Not visualized.
 Adnexa:     No abnormality visualized.
Impression

 There is a single living intrauterine pregancy demonstrating
 an EGA by CRL of  8w 2d. This correlates well with expected
 EGA by early ultrasound of 8w 5d .

 Small cystic focus seen associated with the anterior aspect of
 the developing placenta adjacent to the cord insertion site,
 but not a cord cyst. This is of questionable significance at this
 early stage and reassessment is recommended at the time of
 early aneuploidy screening or anatomic assessment.

 Nonvisualized ovaries.

## 2013-01-11 ENCOUNTER — Other Ambulatory Visit: Payer: Self-pay | Admitting: Family Medicine

## 2013-01-11 ENCOUNTER — Other Ambulatory Visit (HOSPITAL_COMMUNITY)
Admission: RE | Admit: 2013-01-11 | Discharge: 2013-01-11 | Disposition: A | Discharge status: Home / Self Care | Payer: BC Managed Care – PPO | Source: Ambulatory Visit | Attending: Family Medicine | Admitting: Family Medicine

## 2013-01-11 DIAGNOSIS — Z1151 Encounter for screening for human papillomavirus (HPV): Secondary | ICD-10-CM | POA: Insufficient documentation

## 2013-01-11 DIAGNOSIS — Z01419 Encounter for gynecological examination (general) (routine) without abnormal findings: Secondary | ICD-10-CM | POA: Insufficient documentation

## 2014-10-02 ENCOUNTER — Encounter (HOSPITAL_COMMUNITY): Payer: Self-pay

## 2016-06-19 ENCOUNTER — Other Ambulatory Visit: Payer: Self-pay | Admitting: Physician Assistant

## 2016-06-19 ENCOUNTER — Other Ambulatory Visit (HOSPITAL_COMMUNITY)
Admission: RE | Admit: 2016-06-19 | Discharge: 2016-06-19 | Disposition: A | Discharge status: Home / Self Care | Payer: BLUE CROSS/BLUE SHIELD | Source: Ambulatory Visit | Attending: Physician Assistant | Admitting: Physician Assistant

## 2016-06-19 DIAGNOSIS — Z124 Encounter for screening for malignant neoplasm of cervix: Secondary | ICD-10-CM | POA: Insufficient documentation

## 2016-06-19 DIAGNOSIS — Z1151 Encounter for screening for human papillomavirus (HPV): Secondary | ICD-10-CM | POA: Insufficient documentation

## 2016-06-20 LAB — CYTOLOGY - PAP

## 2017-04-01 ENCOUNTER — Ambulatory Visit (INDEPENDENT_AMBULATORY_CARE_PROVIDER_SITE_OTHER): Payer: BLUE CROSS/BLUE SHIELD | Admitting: Obstetrics & Gynecology

## 2017-04-01 ENCOUNTER — Encounter: Payer: Self-pay | Admitting: Obstetrics & Gynecology

## 2017-04-01 VITALS — BP 118/76 | Ht 64.0 in | Wt 131.0 lb

## 2017-04-01 DIAGNOSIS — L680 Hirsutism: Secondary | ICD-10-CM | POA: Insufficient documentation

## 2017-04-01 DIAGNOSIS — N921 Excessive and frequent menstruation with irregular cycle: Secondary | ICD-10-CM | POA: Diagnosis not present

## 2017-04-01 LAB — CBC
HEMATOCRIT: 40.6 % (ref 35.0–45.0)
HEMOGLOBIN: 13.8 g/dL (ref 11.7–15.5)
MCH: 30.5 pg (ref 27.0–33.0)
MCHC: 34 g/dL (ref 32.0–36.0)
MCV: 89.8 fL (ref 80.0–100.0)
MPV: 10.3 fL (ref 7.5–12.5)
Platelets: 267 10*3/uL (ref 140–400)
RBC: 4.52 MIL/uL (ref 3.80–5.10)
RDW: 13.2 % (ref 11.0–15.0)
WBC: 6.1 10*3/uL (ref 3.8–10.8)

## 2017-04-01 LAB — TSH: TSH: 1.03 mIU/L

## 2017-04-01 NOTE — Progress Notes (Signed)
    Vanessa Mccarty 08-May-1986 [id number]        31 y.o.  [id number] Married, Vasectomy.  Daughter 84 yo and son 81 yo.  RP:  Polymenorrhagia worsening x 3 months  C/O periods becoming heavier, lasting 7-10 days and cycles shorter about q3 wks x last 3 months.  Patient feels always either in PMS or on period.  Increased breakouts premenstrually and more hair growth at sides of face and chin.  No abnormal vaginal d/c.  No pelvic pain outside periods.  No dyspareunia.  Declines STI screen.  Past medical history,surgical history, problem list, medications, allergies, family history and social history were all reviewed and documented in the EPIC chart.  Directed ROS with pertinent positives and negatives documented in the history of present illness/assessment and plan.  Exam:  Vitals:   04/01/17 0933  BP: 118/76  Weight: 131 lb (59.4 kg)  Height:  (1.626 m)   General appearance:  Normal  Gyn exam:  Vulva normal                     Bimanual exam:  Uterus RV, normal volume, mobile.  No adnexal mass.  No tenderness.                     No abnormal d/c.  Assessment/Plan:  31 y.o. [id number]  1. Menorrhagia with irregular cycle Polymenorrhagia.  PCOS?  R/O Endometrial pathology.  Decision to investigate further with labs and pelvic US.  Treatment to control the cycle discussed including Progestin IUD and Progestin-Only pill.  Will wait on all results to make final decision on treatment.  - TSH - Prolactin - Hemoglobin A1c - CBC - US Transvaginal Non-OB; Future  2. 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    - Testosterone   Counseling on above issues >50% x 30 minutes.  [name] MD, 9:45 AM 04/01/2017

## 2017-04-01 NOTE — Patient Instructions (Signed)
You presented for longer, heavier periods with shorter cycles giving you 2 periods per month for the last 3 months.  You also noted more hair growth and breakouts.  We therefore investigated with labs today and you will come back to complete with a Pelvic US.  At follow-up, with all the results, we will be able to decide on the best treatment to control your cycle.  Vanessa Mccarty, it was a pleasure to meet you today and see you soon!

## 2017-04-01 NOTE — Addendum Note (Signed)
Addended by: Berna Spare A on: 04/01/2017 10:33 AM   Modules accepted: Orders

## 2017-04-02 LAB — HEMOGLOBIN A1C
Hgb A1c MFr Bld: 4.7 % (ref <5.7)
Mean Plasma Glucose: 88 mg/dL

## 2017-04-02 LAB — PROLACTIN: Prolactin: 4.2 ng/mL

## 2017-04-05 LAB — TESTOS,TOTAL,FREE AND SHBG (FEMALE)
Sex Hormone Binding Glob.: 44 nmol/L (ref 17–124)
TESTOSTERONE,FREE: 1.7 pg/mL (ref 0.1–6.4)
Testosterone,Total,LC/MS/MS: 15 ng/dL (ref 2–45)

## 2017-04-15 ENCOUNTER — Other Ambulatory Visit: Payer: Self-pay | Admitting: Obstetrics & Gynecology

## 2017-04-15 ENCOUNTER — Ambulatory Visit (INDEPENDENT_AMBULATORY_CARE_PROVIDER_SITE_OTHER): Payer: BLUE CROSS/BLUE SHIELD | Admitting: Obstetrics & Gynecology

## 2017-04-15 ENCOUNTER — Ambulatory Visit (INDEPENDENT_AMBULATORY_CARE_PROVIDER_SITE_OTHER): Payer: BLUE CROSS/BLUE SHIELD

## 2017-04-15 VITALS — BP 120/76

## 2017-04-15 DIAGNOSIS — N831 Corpus luteum cyst of ovary, unspecified side: Secondary | ICD-10-CM | POA: Diagnosis not present

## 2017-04-15 DIAGNOSIS — N921 Excessive and frequent menstruation with irregular cycle: Secondary | ICD-10-CM

## 2017-04-15 DIAGNOSIS — N920 Excessive and frequent menstruation with regular cycle: Secondary | ICD-10-CM | POA: Diagnosis not present

## 2017-04-15 MED ORDER — NORETHIN ACE-ETH ESTRAD-FE 1-20 MG-MCG(24) PO TABS: 1.0000 | ORAL_TABLET | Freq: Every day | ORAL | 4 refills | Status: DC

## 2017-04-15 NOTE — Progress Notes (Signed)
    Vanessa Mccarty 08-09-1986 [id number]        31 y.o.  G3P2012 LMP 03/26/2017  RP:  Polymenorrhagia for Pelvic US  Past medical history,surgical history, problem list, medications, allergies, family history and social history were all reviewed and documented in the EPIC chart.  Directed ROS with pertinent positives and negatives documented in the history of present illness/assessment and plan.  Exam:  There were no vitals filed for this visit. General appearance:  Normal  Pelvic US here today:  T/V Anterverted Uterus 9.3 x 5.3 x 4.5 cm, with tri-layered Endometrium at 13.2 mm.  Rt Ovary with small thick walled Corpus Luteum Cyst 2.0 x 1.6 mm, blood flow present at periphery by Doppler, Echofree follicle 2.1 x 2.1 cm.  Lt Ovary normal.  No FF in CDS.   Labs: Hb 13.8           TSH normal, Prl normal, HbA1C normal, Testo normal   Assessment/Plan:  31 y.o. [id number]   1. Unusually frequent menses Probably early ovulation.  Labs and Pelvic US normal.  2. Menorrhagia with regular cycle No anemia.  Decision to control cycle with BCPs.  No CI.  Prescribed Norethindrone/Estradiol 24 Fe 1/20.  Usage discussed, may use continuously at times.  Counseling on above issues and US findings >50% x 15 minutes.  Genia DelMarie-Lyne Darryle Dennie MD, 2:55 PM 04/15/2017

## 2017-04-15 NOTE — Patient Instructions (Signed)
1. Unusually frequent menses Probably early ovulation.  Labs and Pelvic US normal.  2. Menorrhagia with regular cycle No anemia.  Decision to control cycle with BCPs.  No CI.  Prescribed Norethindrone/Estradiol 24 Fe 1/20.  Usage discussed, may use continuously at times.   Good to see you today Vanessa Mccarty!  Please call me if you have any problems with the BCPs or other Gyn issues.

## 2018-06-28 ENCOUNTER — Encounter: Payer: Self-pay | Admitting: Obstetrics & Gynecology

## 2018-06-28 ENCOUNTER — Ambulatory Visit (INDEPENDENT_AMBULATORY_CARE_PROVIDER_SITE_OTHER): Payer: BLUE CROSS/BLUE SHIELD | Admitting: Obstetrics & Gynecology

## 2018-06-28 VITALS — BP 128/76 | Ht 63.75 in | Wt 133.8 lb

## 2018-06-28 DIAGNOSIS — Z01419 Encounter for gynecological examination (general) (routine) without abnormal findings: Secondary | ICD-10-CM | POA: Diagnosis not present

## 2018-06-28 DIAGNOSIS — G43829 Menstrual migraine, not intractable, without status migrainosus: Secondary | ICD-10-CM | POA: Diagnosis not present

## 2018-06-28 DIAGNOSIS — Z9189 Other specified personal risk factors, not elsewhere classified: Secondary | ICD-10-CM

## 2018-06-28 MED ORDER — NORGESTIMATE-ETH ESTRADIOL 0.25-35 MG-MCG PO TABS: 1.0000 | ORAL_TABLET | Freq: Every day | ORAL | 4 refills | Status: DC

## 2018-06-28 NOTE — Progress Notes (Signed)
Vanessa Mccarty [birth year] [id number]   History:    32 y.o. G3P2A1L2 Married.  Vasectomy.  Daughter 33 yo, son 516 yo.  RP:  Established patient presenting for annual gyn exam   HPI: On LoEstrin generic 24 Fe 1/20 with frequent migraines.  No breakthrough bleeding.  No pelvic pain.  Normal secretions.  No pain with intercourse.  Vasectomy.  Urine and bowel movements normal.  Breasts normal.  Body mass index 23.15.  Past medical history,surgical history, family history and social history were all reviewed and documented in the EPIC chart.  Gynecologic History Patient's last menstrual period was 05/24/2018. Contraception: OCP (estrogen/progesterone) and vasectomy Last Pap: 05/2016. Results were: Negative, HPV HR neg Last mammogram: Never Bone Density: Never Colonoscopy: Never  Obstetric History OB History  Gravida Para Term Preterm AB Living  3 2 2  0 1 2  SAB TAB Ectopic Multiple Live Births  1 0 0 0 1    # Outcome Date GA Lbr Len/2nd Weight Sex Delivery Anes PTL Lv  3 Term 07/10/11 [id number] 04:50 / 00:12 8 lb 12.4 oz (3.98 kg) M Vag-Spont EPI  LIV     Birth Comments: wnl  2 SAB           1 Term              ROS: A ROS was performed and pertinent positives and negatives are included in the history.  GENERAL: No fevers or chills. HEENT: No change in vision, no earache, sore throat or sinus congestion. NECK: No pain or stiffness. CARDIOVASCULAR: No chest pain or pressure. No palpitations. PULMONARY: No shortness of breath, cough or wheeze. GASTROINTESTINAL: No abdominal pain, nausea, vomiting or diarrhea, melena or bright red blood per rectum. GENITOURINARY: No urinary frequency, urgency, hesitancy or dysuria. MUSCULOSKELETAL: No joint or muscle pain, no back pain, no recent trauma. DERMATOLOGIC: No rash, no itching, no lesions. ENDOCRINE: No polyuria, polydipsia, no heat or cold intolerance. No recent change in weight. HEMATOLOGICAL: No anemia or easy bruising or bleeding. NEUROLOGIC:  No headache, seizures, numbness, tingling or weakness. PSYCHIATRIC: No depression, no loss of interest in normal activity or change in sleep pattern.     Exam:   BP 128/76   Ht 5' 3.75" (1.619 m)   Wt 133 lb 12.8 oz (60.7 kg)   LMP 05/24/2018   BMI 23.15 kg/m   Body mass index is 23.15 kg/m.  General appearance : Well developed well nourished female. No acute distress HEENT: Eyes: no retinal hemorrhage or exudates,  Neck supple, trachea midline, no carotid bruits, no thyroidmegaly Lungs: Clear to auscultation, no rhonchi or wheezes, or rib retractions  Heart: Regular rate and rhythm, no murmurs or gallops Breast:Examined in sitting and supine position were symmetrical in appearance, no palpable masses or tenderness,  no skin retraction, no nipple inversion, no nipple discharge, no skin discoloration, no axillary or supraclavicular lymphadenopathy Abdomen: no palpable masses or tenderness, no rebound or guarding Extremities: no edema or skin discoloration or tenderness  Pelvic: Vulva: Normal             Vagina: No gross lesions or discharge  Cervix: No gross lesions or discharge.  Pap reflex done.  Uterus  AV, normal size, shape and consistency, non-tender and mobile  Adnexa  Without masses or tenderness  Anus:  Normal   Assessment/Plan:  32 y.o. female for annual exam   1. Encounter for routine gynecological examination with Papanicolaou smear of cervix Normal gynecologic exam.  Pap  reflex done today.  Breast exam normal. - Pap IG w/ reflex to HPV when ASC-U  2. Relies on partner vasectomy for contraception  3. Menstrual migraine without status migrainosus, not intractable On birth control pill to control cycle, but increased migraines on Loestrin generic 24 FE 1/20.  Decision to change to Sprintec.  Patient will call back if no improvements on migraine and will refer to neurologist.  Other orders - sertraline (ZOLOFT) 50 MG tablet; Take 50 mg by mouth daily. -  norgestimate-ethinyl estradiol (ORTHO-CYCLEN,SPRINTEC,PREVIFEM) 0.25-35 MG-MCG tablet; Take 1 tablet by mouth daily.  [name] MD, 4:23 PM 06/28/2018

## 2018-06-29 LAB — PAP IG W/ RFLX HPV ASCU

## 2018-07-01 ENCOUNTER — Encounter: Payer: Self-pay | Admitting: Obstetrics & Gynecology

## 2018-07-01 NOTE — Patient Instructions (Signed)
1. Encounter for routine gynecological examination with Papanicolaou smear of cervix Normal gynecologic exam.  Pap reflex done today.  Breast exam normal. - Pap IG w/ reflex to HPV when ASC-U  2. Relies on partner vasectomy for contraception  3. Menstrual migraine without status migrainosus, not intractable On birth control pill to control cycle, but increased migraines on Loestrin generic 24 FE 1/20.  Decision to change to Sprintec.  Patient will call back if no improvements on migraine and will refer to neurologist.  Other orders - sertraline (ZOLOFT) 50 MG tablet; Take 50 mg by mouth daily. - norgestimate-ethinyl estradiol (ORTHO-CYCLEN,SPRINTEC,PREVIFEM) 0.25-35 MG-MCG tablet; Take 1 tablet by mouth daily.  Nedra HaiLee, it was a pleasure seeing you today!  I will inform you of your results as soon as they are available.

## 2020-02-27 ENCOUNTER — Encounter: Payer: Self-pay | Admitting: Obstetrics & Gynecology

## 2020-02-27 ENCOUNTER — Other Ambulatory Visit: Payer: Self-pay

## 2020-02-27 ENCOUNTER — Ambulatory Visit (INDEPENDENT_AMBULATORY_CARE_PROVIDER_SITE_OTHER): Payer: BC Managed Care – PPO | Admitting: Obstetrics & Gynecology

## 2020-02-27 VITALS — BP 122/78 | Ht 63.5 in | Wt 136.0 lb

## 2020-02-27 DIAGNOSIS — Z01419 Encounter for gynecological examination (general) (routine) without abnormal findings: Secondary | ICD-10-CM | POA: Diagnosis not present

## 2020-02-27 DIAGNOSIS — Z9189 Other specified personal risk factors, not elsewhere classified: Secondary | ICD-10-CM | POA: Diagnosis not present

## 2020-02-27 NOTE — Addendum Note (Signed)
Addended by: Berna Spare A on: 02/27/2020 03:44 PM   Modules accepted: Orders

## 2020-02-27 NOTE — Patient Instructions (Signed)
1. Encounter for routine gynecological examination with Papanicolaou smear of cervix Normal gynecologic exam.  Pap reflex done.  Breast exam normal.  Good body mass index at 23.71.  Continue with fitness.  2. Relies on partner vasectomy for contraception  Other orders - buPROPion (WELLBUTRIN XL) 300 MG 24 hr tablet; Take 300 mg by mouth daily. - DULoxetine (CYMBALTA) 20 MG capsule; Take 20 mg by mouth daily.  Vanessa Mccarty, it was a pleasure seeing you today!  I will inform you of your results as soon as they are available.

## 2020-02-27 NOTE — Progress Notes (Signed)
Vanessa Mccarty 10/16/1986 [id number]   History:    34 y.o. G3P2A1L2 Married.  Vasectomy.  Daughter 75+ yo, son 90 yo.  RP:  Established patient presenting for annual gyn exam   HPI: Menses regular normal flow monthly.  No breakthrough bleeding.  No pelvic pain.  Normal secretions.  No pain with intercourse.  Vasectomy.  Urine and bowel movements normal.  Breasts normal.  Body mass index 23.71.  Good fitness.  Nutrition no veggies or fruits but takes a multivitamin.   Past medical history,surgical history, family history and social history were all reviewed and documented in the EPIC chart.  Gynecologic History Patient's last menstrual period was 02/11/2020.  Obstetric History OB History  Gravida Para Term Preterm AB Living  3 2 2  0 1 2  SAB TAB Ectopic Multiple Live Births  1 0 0 0 1    # Outcome Date GA Lbr Len/2nd Weight Sex Delivery Anes PTL Lv  3 Term 07/10/11 [redacted]w[redacted]d 04:50 / 00:12 8 lb 12.4 oz (3.98 kg) M Vag-Spont EPI  LIV     Birth Comments: wnl  2 SAB           1 Term              ROS: A ROS was performed and pertinent positives and negatives are included in the history.  GENERAL: No fevers or chills. HEENT: No change in vision, no earache, sore throat or sinus congestion. NECK: No pain or stiffness. CARDIOVASCULAR: No chest pain or pressure. No palpitations. PULMONARY: No shortness of breath, cough or wheeze. GASTROINTESTINAL: No abdominal pain, nausea, vomiting or diarrhea, melena or bright red blood per rectum. GENITOURINARY: No urinary frequency, urgency, hesitancy or dysuria. MUSCULOSKELETAL: No joint or muscle pain, no back pain, no recent trauma. DERMATOLOGIC: No rash, no itching, no lesions. ENDOCRINE: No polyuria, polydipsia, no heat or cold intolerance. No recent change in weight. HEMATOLOGICAL: No anemia or easy bruising or bleeding. NEUROLOGIC: No headache, seizures, numbness, tingling or weakness. PSYCHIATRIC: No depression, no loss of interest in normal  activity or change in sleep pattern.     Exam:   BP 122/78   Ht 5' 3.5" (1.613 m)   Wt 136 lb (61.7 kg)   LMP 02/11/2020 Comment: NO BIRTH CONTROL   BMI 23.71 kg/m   Body mass index is 23.71 kg/m.  General appearance : Well developed well nourished female. No acute distress HEENT: Eyes: no retinal hemorrhage or exudates,  Neck supple, trachea midline, no carotid bruits, no thyroidmegaly Lungs: Clear to auscultation, no rhonchi or wheezes, or rib retractions  Heart: Regular rate and rhythm, no murmurs or gallops Breast:Examined in sitting and supine position were symmetrical in appearance, no palpable masses or tenderness,  no skin retraction, no nipple inversion, no nipple discharge, no skin discoloration, no axillary or supraclavicular lymphadenopathy Abdomen: no palpable masses or tenderness, no rebound or guarding Extremities: no edema or skin discoloration or tenderness  Pelvic: Vulva: Normal             Vagina: No gross lesions or discharge  Cervix: No gross lesions or discharge.  Pap reflex done.  Uterus  AV, normal size, shape and consistency, non-tender and mobile  Adnexa  Without masses or tenderness  Anus: Normal   Assessment/Plan:  34 y.o. female for annual exam   1. Encounter for routine gynecological examination with Papanicolaou smear of cervix Normal gynecologic exam.  Pap reflex done.  Breast exam normal.  Good body mass index  at 23.71.  Continue with fitness.  2. Relies on partner vasectomy for contraception  Other orders - buPROPion (WELLBUTRIN XL) 300 MG 24 hr tablet; Take 300 mg by mouth daily. - DULoxetine (CYMBALTA) 20 MG capsule; Take 20 mg by mouth daily.  Princess Bruins MD, 2:54 PM 02/27/2020

## 2020-02-28 LAB — PAP IG W/ RFLX HPV ASCU

## 2021-02-27 ENCOUNTER — Ambulatory Visit (INDEPENDENT_AMBULATORY_CARE_PROVIDER_SITE_OTHER): Payer: BC Managed Care – PPO | Admitting: Obstetrics & Gynecology

## 2021-02-27 ENCOUNTER — Other Ambulatory Visit: Payer: Self-pay

## 2021-02-27 ENCOUNTER — Encounter: Payer: Self-pay | Admitting: Obstetrics & Gynecology

## 2021-02-27 VITALS — BP 110/78 | Ht 63.0 in | Wt 145.0 lb

## 2021-02-27 DIAGNOSIS — Z9189 Other specified personal risk factors, not elsewhere classified: Secondary | ICD-10-CM

## 2021-02-27 DIAGNOSIS — N920 Excessive and frequent menstruation with regular cycle: Secondary | ICD-10-CM | POA: Diagnosis not present

## 2021-02-27 DIAGNOSIS — Z01419 Encounter for gynecological examination (general) (routine) without abnormal findings: Secondary | ICD-10-CM

## 2021-02-27 MED ORDER — NORETHINDRONE 0.35 MG PO TABS: 1.0000 | ORAL_TABLET | Freq: Every day | ORAL | 4 refills | Status: DC

## 2021-02-27 NOTE — Progress Notes (Signed)
Vanessa Mccarty August 31, 1986 [id number]   History:    35 y.o. G3P2A1L2 Married. Vasectomy. Daughter 58+ yo, son 51 yo.  LS:[id number] presenting for annual gyn exam   HPI: Menses regular every month, but increased flow and dysmenorrhea x several months.No breakthrough bleeding. No pelvic pain. Normal secretions. No pain with intercourse. Vasectomy. Urine and bowel movements normal. Breasts normal. Body mass index 25.69.  Good fitness.  Nutrition no veggies or fruits but takes a multivitamin.   Past medical history,surgical history, family history and social history were all reviewed and documented in the EPIC chart.  Gynecologic History Patient's last menstrual period was 02/09/2021.  Obstetric History OB History  Gravida Para Term Preterm AB Living  3 2 2  0 1 2  SAB IAB Ectopic Multiple Live Births  1 0 0 0 1    # Outcome Date GA Lbr Len/2nd Weight Sex Delivery Anes PTL Lv  3 Term 07/10/11 [redacted]w[redacted]d 04:50 / 00:12 8 lb 12.4 oz (3.98 kg) M Vag-Spont EPI  LIV     Birth Comments: wnl  2 SAB           1 Term              ROS: A ROS was performed and pertinent positives and negatives are included in the history.  GENERAL: No fevers or chills. HEENT: No change in vision, no earache, sore throat or sinus congestion. NECK: No pain or stiffness. CARDIOVASCULAR: No chest pain or pressure. No palpitations. PULMONARY: No shortness of breath, cough or wheeze. GASTROINTESTINAL: No abdominal pain, nausea, vomiting or diarrhea, melena or bright red blood per rectum. GENITOURINARY: No urinary frequency, urgency, hesitancy or dysuria. MUSCULOSKELETAL: No joint or muscle pain, no back pain, no recent trauma. DERMATOLOGIC: No rash, no itching, no lesions. ENDOCRINE: No polyuria, polydipsia, no heat or cold intolerance. No recent change in weight. HEMATOLOGICAL: No anemia or easy bruising or bleeding. NEUROLOGIC: No headache, seizures, numbness, tingling or weakness. PSYCHIATRIC: No  depression, no loss of interest in normal activity or change in sleep pattern.     Exam:   BP 110/78 (BP Location: Right Arm, Patient Position: Sitting, Cuff Size: Normal)   Ht 5\' 3"  (1.6 m)   Wt 145 lb (65.8 kg)   LMP 02/09/2021   BMI 25.69 kg/m   Body mass index is 25.69 kg/m.  General appearance : Well developed well nourished female. No acute distress HEENT: Eyes: no retinal hemorrhage or exudates,  Neck supple, trachea midline, no carotid bruits, no thyroidmegaly Lungs: Clear to auscultation, no rhonchi or wheezes, or rib retractions  Heart: Regular rate and rhythm, no murmurs or gallops Breast:Examined in sitting and supine position were symmetrical in appearance, no palpable masses or tenderness,  no skin retraction, no nipple inversion, no nipple discharge, no skin discoloration, no axillary or supraclavicular lymphadenopathy Abdomen: no palpable masses or tenderness, no rebound or guarding Extremities: no edema or skin discoloration or tenderness  Pelvic: Vulva: Normal             Vagina: No gross lesions or discharge  Cervix: No gross lesions or discharge.  Pap reflex done.  Uterus  AV, normal size, shape and consistency, non-tender and mobile  Adnexa  Without masses or tenderness  Anus: Normal   Assessment/Plan:  35 y.o. female for annual exam   1. Encounter for routine gynecological examination with Papanicolaou smear of cervix Normal gynecologic exam.  Pap reflex done.  Breast exam normal.  Body mass index good at  25.69.  Continue with fitness and healthy nutrition.  Health labs with family physician as needed.  2. Relies on partner vasectomy for contraception  3. Menorrhagia with regular cycle Increase menstrual flow and dysmenorrhea for several months.  Normal gynecologic exam today.  Decision to treat with the progestin only pill to decrease the menstrual flow and dysmenorrhea.  No contraindication.  Usage reviewed and prescription sent to pharmacy.  If no  improvement on the menorrhagia and dysmenorrhea, patient will complete the investigation with a pelvic ultrasound.  Other orders - Multiple Vitamins-Minerals (MULTIVITAMIN ADULTS PO); Take by mouth. - norethindrone (MICRONOR) 0.35 MG tablet; Take 1 tablet (0.35 mg total) by mouth daily.  [name] MD, 4:19 PM 02/27/2021

## 2021-03-01 ENCOUNTER — Encounter: Payer: Self-pay | Admitting: Obstetrics & Gynecology

## 2021-03-04 LAB — PAP IG W/ RFLX HPV ASCU

## 2021-06-19 ENCOUNTER — Telehealth: Payer: Self-pay

## 2021-06-19 NOTE — Telephone Encounter (Signed)
Patient called. Has been taking generic Micronor since end of March AEX.  C/0 bleeding every two weeks for 7 days or more.  She said progestin only pill not working need to figure out something else.  Your office note mentions ordering ultrasound if persisted. Ok to order?  02/27/2021 ". Menorrhagia with regular cycle Increase menstrual flow and dysmenorrhea for several months.  Normal gynecologic exam today.  Decision to treat with the progestin only pill to decrease the menstrual flow and dysmenorrhea.  No contraindication.  Usage reviewed and prescription sent to pharmacy.  If no improvement on the menorrhagia and dysmenorrhea, patient will complete the investigation with a pelvic ultrasound."

## 2021-06-24 ENCOUNTER — Other Ambulatory Visit: Payer: Self-pay

## 2021-06-24 DIAGNOSIS — N926 Irregular menstruation, unspecified: Secondary | ICD-10-CM

## 2021-06-24 NOTE — Telephone Encounter (Signed)
Patient advised. Order placed and message sent to appointment desk to call her to schedule u/s and visit.

## 2021-07-11 ENCOUNTER — Ambulatory Visit (INDEPENDENT_AMBULATORY_CARE_PROVIDER_SITE_OTHER): Payer: BC Managed Care – PPO

## 2021-07-11 ENCOUNTER — Ambulatory Visit (INDEPENDENT_AMBULATORY_CARE_PROVIDER_SITE_OTHER): Payer: BC Managed Care – PPO | Admitting: Obstetrics & Gynecology

## 2021-07-11 ENCOUNTER — Encounter: Payer: Self-pay | Admitting: Obstetrics & Gynecology

## 2021-07-11 ENCOUNTER — Other Ambulatory Visit: Payer: Self-pay

## 2021-07-11 VITALS — BP 114/80

## 2021-07-11 DIAGNOSIS — N921 Excessive and frequent menstruation with irregular cycle: Secondary | ICD-10-CM

## 2021-07-11 DIAGNOSIS — N926 Irregular menstruation, unspecified: Secondary | ICD-10-CM | POA: Diagnosis not present

## 2021-07-11 MED ORDER — ETONOGESTREL-ETHINYL ESTRADIOL 0.12-0.015 MG/24HR VA RING: 1.0000 | VAGINAL_RING | VAGINAL | 4 refills | Status: DC

## 2021-07-11 NOTE — Progress Notes (Signed)
    Vanessa Mccarty 05-10-1986 [id number]        35 y.o.  [id number] Married.  Vasectomy.  RP: Menometrorrhagia for Pelvic US  HPI: Started on the Progestin pill in 01/2021 for heavy menses.  Since then, still having heavy menses and experiencing BTB in addition.  No pelvic pain except for cramps.   OB History  Gravida Para Term Preterm AB Living  3 2 2  0 1 2  SAB IAB Ectopic Multiple Live Births  1 0 0 0 2    # Outcome Date GA Lbr Len/2nd Weight Sex Delivery Anes PTL Lv  3 Term 07/10/11 [redacted]w[redacted]d 04:50 / 00:12 8 lb 12.4 oz (3.98 kg) M Vag-Spont EPI  LIV     Birth Comments: wnl  2 SAB           1 Term             Past medical history,surgical history, problem list, medications, allergies, family history and social history were all reviewed and documented in the EPIC chart.   Directed ROS with pertinent positives and negatives documented in the history of present illness/assessment and plan.  Exam:  Vitals:   07/11/21 0815  BP: 114/80   General appearance:  Normal  Pelvic 09/10/21 today: T/V images.  Anteverted uterus normal in size and shape with no myometrial mass, the uterus is measured at 8.94 x 5.54 x 4.61 cm.  The endometrial lining is thin and symmetrical with no mass or thickening seen, measured at 4.05 mm.  Right ovary with a simple 1.8 cm avascular cyst.  Left ovary with a 1.7 cm simple avascular cyst.  A 2 cm paraovarian cyst is also present at the right adnexa.  Normal perfusion of both ovaries.  No adnexal mass.  No free fluid in the pelvis.   Assessment/Plan:  35 y.o. 31   1. Menometrorrhagia Still experiencing menometrorrhagia on the progestin pill.  Pelvic ultrasound findings thoroughly reviewed with patient.  Patient reassured that her uterus is normal with a thin endometrium.  Ovaries normal.  Decision to change hormonal management to NuvaRing.  No contraindication.  Usage reviewed and prescription sent to pharmacy.  2. Breakthrough bleeding on birth control  pills As above.  Decision to change from the progestin pill to NuvaRing.  Other orders - lamoTRIgine (LAMICTAL) 150 MG tablet; Take 150 mg by mouth daily. - clonazePAM (KLONOPIN) 0.5 MG tablet; Take 0.5 mg by mouth as needed for anxiety. - etonogestrel-ethinyl estradiol (NUVARING) 0.12-0.015 MG/24HR vaginal ring; Place 1 each vaginally every 28 (twenty-eight) days. Insert vaginally and leave in place for 3 consecutive weeks, then remove for 1 week.  May use continuously as needed.   12-27-1973 MD, 8:53 AM 07/11/2021

## 2021-07-14 ENCOUNTER — Encounter: Payer: Self-pay | Admitting: Obstetrics & Gynecology

## 2021-12-25 DIAGNOSIS — J029 Acute pharyngitis, unspecified: Secondary | ICD-10-CM | POA: Diagnosis not present

## 2021-12-25 DIAGNOSIS — R509 Fever, unspecified: Secondary | ICD-10-CM | POA: Diagnosis not present

## 2021-12-30 DIAGNOSIS — F331 Major depressive disorder, recurrent, moderate: Secondary | ICD-10-CM | POA: Diagnosis not present

## 2021-12-30 DIAGNOSIS — F410 Panic disorder [episodic paroxysmal anxiety] without agoraphobia: Secondary | ICD-10-CM | POA: Diagnosis not present

## 2021-12-30 DIAGNOSIS — F4312 Post-traumatic stress disorder, chronic: Secondary | ICD-10-CM | POA: Diagnosis not present

## 2021-12-30 DIAGNOSIS — F411 Generalized anxiety disorder: Secondary | ICD-10-CM | POA: Diagnosis not present

## 2022-01-20 DIAGNOSIS — F331 Major depressive disorder, recurrent, moderate: Secondary | ICD-10-CM | POA: Diagnosis not present

## 2022-01-20 DIAGNOSIS — F4312 Post-traumatic stress disorder, chronic: Secondary | ICD-10-CM | POA: Diagnosis not present

## 2022-01-20 DIAGNOSIS — F410 Panic disorder [episodic paroxysmal anxiety] without agoraphobia: Secondary | ICD-10-CM | POA: Diagnosis not present

## 2022-01-20 DIAGNOSIS — F411 Generalized anxiety disorder: Secondary | ICD-10-CM | POA: Diagnosis not present

## 2022-01-29 DIAGNOSIS — F900 Attention-deficit hyperactivity disorder, predominantly inattentive type: Secondary | ICD-10-CM | POA: Diagnosis not present

## 2022-01-29 DIAGNOSIS — N921 Excessive and frequent menstruation with irregular cycle: Secondary | ICD-10-CM | POA: Diagnosis not present

## 2022-01-29 DIAGNOSIS — F411 Generalized anxiety disorder: Secondary | ICD-10-CM | POA: Diagnosis not present

## 2022-01-29 DIAGNOSIS — F319 Bipolar disorder, unspecified: Secondary | ICD-10-CM | POA: Diagnosis not present

## 2022-01-29 DIAGNOSIS — M533 Sacrococcygeal disorders, not elsewhere classified: Secondary | ICD-10-CM | POA: Diagnosis not present

## 2022-01-29 DIAGNOSIS — Z0000 Encounter for general adult medical examination without abnormal findings: Secondary | ICD-10-CM | POA: Diagnosis not present

## 2022-02-19 DIAGNOSIS — F410 Panic disorder [episodic paroxysmal anxiety] without agoraphobia: Secondary | ICD-10-CM | POA: Diagnosis not present

## 2022-02-19 DIAGNOSIS — F4312 Post-traumatic stress disorder, chronic: Secondary | ICD-10-CM | POA: Diagnosis not present

## 2022-02-19 DIAGNOSIS — F411 Generalized anxiety disorder: Secondary | ICD-10-CM | POA: Diagnosis not present

## 2022-02-19 DIAGNOSIS — F331 Major depressive disorder, recurrent, moderate: Secondary | ICD-10-CM | POA: Diagnosis not present

## 2022-02-28 ENCOUNTER — Ambulatory Visit (INDEPENDENT_AMBULATORY_CARE_PROVIDER_SITE_OTHER): Payer: BC Managed Care – PPO | Admitting: Obstetrics & Gynecology

## 2022-02-28 ENCOUNTER — Encounter: Payer: Self-pay | Admitting: Obstetrics & Gynecology

## 2022-02-28 VITALS — BP 100/64 | HR 62 | Resp 14 | Ht 63.25 in | Wt 138.0 lb

## 2022-02-28 DIAGNOSIS — Z01419 Encounter for gynecological examination (general) (routine) without abnormal findings: Secondary | ICD-10-CM | POA: Diagnosis not present

## 2022-02-28 DIAGNOSIS — N920 Excessive and frequent menstruation with regular cycle: Secondary | ICD-10-CM | POA: Diagnosis not present

## 2022-02-28 DIAGNOSIS — Z9189 Other specified personal risk factors, not elsewhere classified: Secondary | ICD-10-CM

## 2022-02-28 MED ORDER — ETONOGESTREL-ETHINYL ESTRADIOL 0.12-0.015 MG/24HR VA RING: 1.0000 | VAGINAL_RING | VAGINAL | 4 refills | Status: DC

## 2022-02-28 NOTE — Progress Notes (Signed)
? ? ?Vanessa Mccarty May 28, 1986 [id number] ? ? ?History:    36 y.o.  G3P2A1L2 Married.  Vasectomy.  Daughter 75+ yo, son 61 yo. ?  ?RP:  Established patient presenting for annual gyn exam  ?  ?HPI: Well on Nuvaring x 07/2021 with light menstrual flow.  Pelvic US was normal 07/2021. No breakthrough bleeding.  No pelvic pain.  Normal secretions.  No pain with intercourse. Pap 01/2021 Neg. Vasectomy.  Urine and bowel movements normal.  Right breast normal.  Occasional tenderness upper outer left breast, no lump, no Sx today.  No first degree relative with Breast Ca.  Body mass index 24.25.  Good fitness.  Nutrition no veggies or fruits but takes a multivitamin. ?  ? ?Past medical history,surgical history, family history and social history were all reviewed and documented in the EPIC chart. ? ?Gynecologic History ?No LMP recorded. (Menstrual status: Other). ? ?Obstetric History ?OB History  ?Gravida Para Term Preterm AB Living  ?3 2 2  0 1 2  ?SAB IAB Ectopic Multiple Live Births  ?1 0 0 0 2  ?  ?# Outcome Date GA Lbr Len/2nd Weight Sex Delivery Anes PTL Lv  ?3 Term 07/10/11 [redacted]w[redacted]d 04:50 / 00:12 8 lb 12.4 oz (3.98 kg) M Vag-Spont EPI  LIV  ?   Birth Comments: wnl  ?2 SAB           ?1 Term           ? ? ? ?ROS: A ROS was performed and pertinent positives and negatives are included in the history. ? GENERAL: No fevers or chills. HEENT: No change in vision, no earache, sore throat or sinus congestion. NECK: No pain or stiffness. CARDIOVASCULAR: No chest pain or pressure. No palpitations. PULMONARY: No shortness of breath, cough or wheeze. GASTROINTESTINAL: No abdominal pain, nausea, vomiting or diarrhea, melena or bright red blood per rectum. GENITOURINARY: No urinary frequency, urgency, hesitancy or dysuria. MUSCULOSKELETAL: No joint or muscle pain, no back pain, no recent trauma. DERMATOLOGIC: No rash, no itching, no lesions. ENDOCRINE: No polyuria, polydipsia, no heat or cold intolerance. No recent change in weight.  HEMATOLOGICAL: No anemia or easy bruising or bleeding. NEUROLOGIC: No headache, seizures, numbness, tingling or weakness. PSYCHIATRIC: No depression, no loss of interest in normal activity or change in sleep pattern.  ?  ? ?Exam: ? ? ?BP 100/64   Pulse 62   Resp 14   Ht 5' 3.25" (1.607 m)   Wt 138 lb (62.6 kg)   BMI 24.25 kg/m?  ? ?Body mass index is 24.25 kg/m?. ? ?General appearance : Well developed well nourished female. No acute distress ?HEENT: Eyes: no retinal hemorrhage or exudates,  Neck supple, trachea midline, no carotid bruits, no thyroidmegaly ?Lungs: Clear to auscultation, no rhonchi or wheezes, or rib retractions  ?Heart: Regular rate and rhythm, no murmurs or gallops ?Breast:Examined in sitting and supine position were symmetrical in appearance, no palpable masses or tenderness,  no skin retraction, no nipple inversion, no nipple discharge, no skin discoloration, no axillary or supraclavicular lymphadenopathy ?Abdomen: no palpable masses or tenderness, no rebound or guarding ?Extremities: no edema or skin discoloration or tenderness ? ?Pelvic: Vulva: Normal ?            Vagina: No gross lesions or discharge ? Cervix: No gross lesions or discharge ? Uterus  AV, normal size, shape and consistency, non-tender and mobile ? Adnexa  Without masses or tenderness ? Anus: Normal ? ? ?Assessment/Plan:  36 y.o. female  for annual exam  ? ?1. Well female exam with routine gynecological exam ?Well on Nuvaring x 07/2021 with light menstrual flow.  Pelvic US was normal 07/2021. No breakthrough bleeding.  No pelvic pain.  Normal secretions.  No pain with intercourse. Pap 01/2021 Neg. Vasectomy.  Urine and bowel movements normal.  Right breast normal.  Occasional tenderness upper outer left breast, no lump, no Sx today.  Normal breast exam.  Will be reevaluated with develops constant breast pain or a lump. No first degree relative with Breast Ca.  Body mass index 24.25.  Good fitness.  Nutrition no veggies or fruits  but takes a multivitamin. ? ?2. Relies on partner vasectomy for contraception ? ?3. Menorrhagia with regular cycle ?Well on Nuvaring x 07/2021 with light menstrual flow.  Pelvic US was normal 07/2021. No breakthrough bleeding.  No pelvic pain. No CI to continue on Nuvaring.  Prescription sent to pharmacy. ? ?Other orders ?- amphetamine-dextroamphetamine (ADDERALL XR) 20 MG 24 hr capsule; Take 20 mg by mouth every morning. ?- VRAYLAR 3 MG capsule; SMARTSIG:1 Capsule(s) By Mouth Every Evening ?- FLUoxetine HCl 60 MG TABS; 1 tablet ?- lamoTRIgine (LAMICTAL) 200 MG tablet; Take 200 mg by mouth daily. ?- traZODone (DESYREL) 50 MG tablet; Take 50-100 mg by mouth at bedtime. ?- etonogestrel-ethinyl estradiol (NUVARING) 0.12-0.015 MG/24HR vaginal ring; Place 1 each vaginally every 28 (twenty-eight) days. Insert vaginally and leave in place for 3 consecutive weeks, then remove for 1 week.  May use continuously as needed.  ? ?[name] MD, 9:46 AM 02/28/2022 ? ?  ?

## 2022-03-05 DIAGNOSIS — F330 Major depressive disorder, recurrent, mild: Secondary | ICD-10-CM | POA: Diagnosis not present

## 2022-03-07 DIAGNOSIS — F332 Major depressive disorder, recurrent severe without psychotic features: Secondary | ICD-10-CM | POA: Diagnosis not present

## 2022-03-10 DIAGNOSIS — F332 Major depressive disorder, recurrent severe without psychotic features: Secondary | ICD-10-CM | POA: Diagnosis not present

## 2022-03-11 DIAGNOSIS — F332 Major depressive disorder, recurrent severe without psychotic features: Secondary | ICD-10-CM | POA: Diagnosis not present

## 2022-03-12 DIAGNOSIS — F332 Major depressive disorder, recurrent severe without psychotic features: Secondary | ICD-10-CM | POA: Diagnosis not present

## 2022-03-13 DIAGNOSIS — F332 Major depressive disorder, recurrent severe without psychotic features: Secondary | ICD-10-CM | POA: Diagnosis not present

## 2022-03-14 DIAGNOSIS — F332 Major depressive disorder, recurrent severe without psychotic features: Secondary | ICD-10-CM | POA: Diagnosis not present

## 2022-03-17 DIAGNOSIS — F332 Major depressive disorder, recurrent severe without psychotic features: Secondary | ICD-10-CM | POA: Diagnosis not present

## 2022-03-18 DIAGNOSIS — F332 Major depressive disorder, recurrent severe without psychotic features: Secondary | ICD-10-CM | POA: Diagnosis not present

## 2022-03-19 DIAGNOSIS — F332 Major depressive disorder, recurrent severe without psychotic features: Secondary | ICD-10-CM | POA: Diagnosis not present

## 2022-03-20 DIAGNOSIS — F332 Major depressive disorder, recurrent severe without psychotic features: Secondary | ICD-10-CM | POA: Diagnosis not present

## 2022-03-21 DIAGNOSIS — F332 Major depressive disorder, recurrent severe without psychotic features: Secondary | ICD-10-CM | POA: Diagnosis not present

## 2022-03-24 DIAGNOSIS — F332 Major depressive disorder, recurrent severe without psychotic features: Secondary | ICD-10-CM | POA: Diagnosis not present

## 2022-03-25 DIAGNOSIS — F332 Major depressive disorder, recurrent severe without psychotic features: Secondary | ICD-10-CM | POA: Diagnosis not present

## 2022-03-26 DIAGNOSIS — F332 Major depressive disorder, recurrent severe without psychotic features: Secondary | ICD-10-CM | POA: Diagnosis not present

## 2022-03-27 DIAGNOSIS — F332 Major depressive disorder, recurrent severe without psychotic features: Secondary | ICD-10-CM | POA: Diagnosis not present

## 2022-03-28 DIAGNOSIS — F332 Major depressive disorder, recurrent severe without psychotic features: Secondary | ICD-10-CM | POA: Diagnosis not present

## 2022-03-31 DIAGNOSIS — F332 Major depressive disorder, recurrent severe without psychotic features: Secondary | ICD-10-CM | POA: Diagnosis not present

## 2022-04-01 DIAGNOSIS — F332 Major depressive disorder, recurrent severe without psychotic features: Secondary | ICD-10-CM | POA: Diagnosis not present

## 2022-04-02 DIAGNOSIS — F332 Major depressive disorder, recurrent severe without psychotic features: Secondary | ICD-10-CM | POA: Diagnosis not present

## 2022-04-03 DIAGNOSIS — F332 Major depressive disorder, recurrent severe without psychotic features: Secondary | ICD-10-CM | POA: Diagnosis not present

## 2022-04-04 DIAGNOSIS — F332 Major depressive disorder, recurrent severe without psychotic features: Secondary | ICD-10-CM | POA: Diagnosis not present

## 2022-04-07 DIAGNOSIS — F332 Major depressive disorder, recurrent severe without psychotic features: Secondary | ICD-10-CM | POA: Diagnosis not present

## 2022-04-08 DIAGNOSIS — F332 Major depressive disorder, recurrent severe without psychotic features: Secondary | ICD-10-CM | POA: Diagnosis not present

## 2022-04-09 DIAGNOSIS — F332 Major depressive disorder, recurrent severe without psychotic features: Secondary | ICD-10-CM | POA: Diagnosis not present

## 2022-04-10 DIAGNOSIS — F332 Major depressive disorder, recurrent severe without psychotic features: Secondary | ICD-10-CM | POA: Diagnosis not present

## 2022-04-11 DIAGNOSIS — F332 Major depressive disorder, recurrent severe without psychotic features: Secondary | ICD-10-CM | POA: Diagnosis not present

## 2022-04-14 DIAGNOSIS — F332 Major depressive disorder, recurrent severe without psychotic features: Secondary | ICD-10-CM | POA: Diagnosis not present

## 2022-04-15 DIAGNOSIS — F332 Major depressive disorder, recurrent severe without psychotic features: Secondary | ICD-10-CM | POA: Diagnosis not present

## 2022-04-16 DIAGNOSIS — F332 Major depressive disorder, recurrent severe without psychotic features: Secondary | ICD-10-CM | POA: Diagnosis not present

## 2022-04-17 DIAGNOSIS — F332 Major depressive disorder, recurrent severe without psychotic features: Secondary | ICD-10-CM | POA: Diagnosis not present

## 2022-04-29 DIAGNOSIS — F331 Major depressive disorder, recurrent, moderate: Secondary | ICD-10-CM | POA: Diagnosis not present

## 2022-04-29 DIAGNOSIS — F410 Panic disorder [episodic paroxysmal anxiety] without agoraphobia: Secondary | ICD-10-CM | POA: Diagnosis not present

## 2022-04-29 DIAGNOSIS — F411 Generalized anxiety disorder: Secondary | ICD-10-CM | POA: Diagnosis not present

## 2022-04-29 DIAGNOSIS — F4312 Post-traumatic stress disorder, chronic: Secondary | ICD-10-CM | POA: Diagnosis not present

## 2022-06-09 DIAGNOSIS — F4312 Post-traumatic stress disorder, chronic: Secondary | ICD-10-CM | POA: Diagnosis not present

## 2022-06-09 DIAGNOSIS — F331 Major depressive disorder, recurrent, moderate: Secondary | ICD-10-CM | POA: Diagnosis not present

## 2022-06-09 DIAGNOSIS — F410 Panic disorder [episodic paroxysmal anxiety] without agoraphobia: Secondary | ICD-10-CM | POA: Diagnosis not present

## 2022-06-09 DIAGNOSIS — F411 Generalized anxiety disorder: Secondary | ICD-10-CM | POA: Diagnosis not present

## 2022-07-14 DIAGNOSIS — F4312 Post-traumatic stress disorder, chronic: Secondary | ICD-10-CM | POA: Diagnosis not present

## 2022-07-14 DIAGNOSIS — F411 Generalized anxiety disorder: Secondary | ICD-10-CM | POA: Diagnosis not present

## 2022-07-14 DIAGNOSIS — F410 Panic disorder [episodic paroxysmal anxiety] without agoraphobia: Secondary | ICD-10-CM | POA: Diagnosis not present

## 2022-07-14 DIAGNOSIS — F331 Major depressive disorder, recurrent, moderate: Secondary | ICD-10-CM | POA: Diagnosis not present

## 2022-08-01 DIAGNOSIS — F330 Major depressive disorder, recurrent, mild: Secondary | ICD-10-CM | POA: Diagnosis not present

## 2022-08-01 DIAGNOSIS — F332 Major depressive disorder, recurrent severe without psychotic features: Secondary | ICD-10-CM | POA: Diagnosis not present

## 2022-08-05 DIAGNOSIS — F330 Major depressive disorder, recurrent, mild: Secondary | ICD-10-CM | POA: Diagnosis not present

## 2022-08-08 DIAGNOSIS — F332 Major depressive disorder, recurrent severe without psychotic features: Secondary | ICD-10-CM | POA: Diagnosis not present

## 2022-08-11 DIAGNOSIS — F332 Major depressive disorder, recurrent severe without psychotic features: Secondary | ICD-10-CM | POA: Diagnosis not present

## 2022-08-12 DIAGNOSIS — F332 Major depressive disorder, recurrent severe without psychotic features: Secondary | ICD-10-CM | POA: Diagnosis not present

## 2022-08-13 DIAGNOSIS — F332 Major depressive disorder, recurrent severe without psychotic features: Secondary | ICD-10-CM | POA: Diagnosis not present

## 2022-08-14 DIAGNOSIS — F332 Major depressive disorder, recurrent severe without psychotic features: Secondary | ICD-10-CM | POA: Diagnosis not present

## 2022-08-15 DIAGNOSIS — F332 Major depressive disorder, recurrent severe without psychotic features: Secondary | ICD-10-CM | POA: Diagnosis not present

## 2022-08-18 DIAGNOSIS — F330 Major depressive disorder, recurrent, mild: Secondary | ICD-10-CM | POA: Diagnosis not present

## 2022-08-18 DIAGNOSIS — F332 Major depressive disorder, recurrent severe without psychotic features: Secondary | ICD-10-CM | POA: Diagnosis not present

## 2022-08-19 DIAGNOSIS — F332 Major depressive disorder, recurrent severe without psychotic features: Secondary | ICD-10-CM | POA: Diagnosis not present

## 2022-08-20 DIAGNOSIS — F332 Major depressive disorder, recurrent severe without psychotic features: Secondary | ICD-10-CM | POA: Diagnosis not present

## 2022-08-21 DIAGNOSIS — F332 Major depressive disorder, recurrent severe without psychotic features: Secondary | ICD-10-CM | POA: Diagnosis not present

## 2022-08-22 DIAGNOSIS — F332 Major depressive disorder, recurrent severe without psychotic features: Secondary | ICD-10-CM | POA: Diagnosis not present

## 2022-08-25 DIAGNOSIS — F332 Major depressive disorder, recurrent severe without psychotic features: Secondary | ICD-10-CM | POA: Diagnosis not present

## 2022-08-25 DIAGNOSIS — F331 Major depressive disorder, recurrent, moderate: Secondary | ICD-10-CM | POA: Diagnosis not present

## 2022-08-25 DIAGNOSIS — F411 Generalized anxiety disorder: Secondary | ICD-10-CM | POA: Diagnosis not present

## 2022-08-25 DIAGNOSIS — F410 Panic disorder [episodic paroxysmal anxiety] without agoraphobia: Secondary | ICD-10-CM | POA: Diagnosis not present

## 2022-08-25 DIAGNOSIS — F4312 Post-traumatic stress disorder, chronic: Secondary | ICD-10-CM | POA: Diagnosis not present

## 2022-08-26 DIAGNOSIS — F332 Major depressive disorder, recurrent severe without psychotic features: Secondary | ICD-10-CM | POA: Diagnosis not present

## 2022-08-27 DIAGNOSIS — F332 Major depressive disorder, recurrent severe without psychotic features: Secondary | ICD-10-CM | POA: Diagnosis not present

## 2022-08-28 DIAGNOSIS — F332 Major depressive disorder, recurrent severe without psychotic features: Secondary | ICD-10-CM | POA: Diagnosis not present

## 2022-08-29 DIAGNOSIS — F332 Major depressive disorder, recurrent severe without psychotic features: Secondary | ICD-10-CM | POA: Diagnosis not present

## 2022-09-01 DIAGNOSIS — F332 Major depressive disorder, recurrent severe without psychotic features: Secondary | ICD-10-CM | POA: Diagnosis not present

## 2022-09-02 DIAGNOSIS — F332 Major depressive disorder, recurrent severe without psychotic features: Secondary | ICD-10-CM | POA: Diagnosis not present

## 2022-09-03 DIAGNOSIS — F332 Major depressive disorder, recurrent severe without psychotic features: Secondary | ICD-10-CM | POA: Diagnosis not present

## 2022-09-04 DIAGNOSIS — F332 Major depressive disorder, recurrent severe without psychotic features: Secondary | ICD-10-CM | POA: Diagnosis not present

## 2022-09-05 DIAGNOSIS — F332 Major depressive disorder, recurrent severe without psychotic features: Secondary | ICD-10-CM | POA: Diagnosis not present

## 2022-09-08 DIAGNOSIS — F332 Major depressive disorder, recurrent severe without psychotic features: Secondary | ICD-10-CM | POA: Diagnosis not present

## 2022-09-09 DIAGNOSIS — F332 Major depressive disorder, recurrent severe without psychotic features: Secondary | ICD-10-CM | POA: Diagnosis not present

## 2022-09-10 DIAGNOSIS — F332 Major depressive disorder, recurrent severe without psychotic features: Secondary | ICD-10-CM | POA: Diagnosis not present

## 2022-09-11 DIAGNOSIS — F332 Major depressive disorder, recurrent severe without psychotic features: Secondary | ICD-10-CM | POA: Diagnosis not present

## 2022-09-12 DIAGNOSIS — F332 Major depressive disorder, recurrent severe without psychotic features: Secondary | ICD-10-CM | POA: Diagnosis not present

## 2022-09-15 DIAGNOSIS — F332 Major depressive disorder, recurrent severe without psychotic features: Secondary | ICD-10-CM | POA: Diagnosis not present

## 2022-09-16 DIAGNOSIS — F330 Major depressive disorder, recurrent, mild: Secondary | ICD-10-CM | POA: Diagnosis not present

## 2022-09-16 DIAGNOSIS — F332 Major depressive disorder, recurrent severe without psychotic features: Secondary | ICD-10-CM | POA: Diagnosis not present

## 2022-09-17 DIAGNOSIS — F332 Major depressive disorder, recurrent severe without psychotic features: Secondary | ICD-10-CM | POA: Diagnosis not present

## 2022-09-18 DIAGNOSIS — F332 Major depressive disorder, recurrent severe without psychotic features: Secondary | ICD-10-CM | POA: Diagnosis not present

## 2022-09-22 DIAGNOSIS — F332 Major depressive disorder, recurrent severe without psychotic features: Secondary | ICD-10-CM | POA: Diagnosis not present

## 2022-09-24 DIAGNOSIS — F332 Major depressive disorder, recurrent severe without psychotic features: Secondary | ICD-10-CM | POA: Diagnosis not present

## 2022-09-29 DIAGNOSIS — F410 Panic disorder [episodic paroxysmal anxiety] without agoraphobia: Secondary | ICD-10-CM | POA: Diagnosis not present

## 2022-09-29 DIAGNOSIS — F331 Major depressive disorder, recurrent, moderate: Secondary | ICD-10-CM | POA: Diagnosis not present

## 2022-09-29 DIAGNOSIS — F411 Generalized anxiety disorder: Secondary | ICD-10-CM | POA: Diagnosis not present

## 2022-09-29 DIAGNOSIS — F4312 Post-traumatic stress disorder, chronic: Secondary | ICD-10-CM | POA: Diagnosis not present

## 2022-09-29 DIAGNOSIS — F332 Major depressive disorder, recurrent severe without psychotic features: Secondary | ICD-10-CM | POA: Diagnosis not present

## 2022-10-01 DIAGNOSIS — F332 Major depressive disorder, recurrent severe without psychotic features: Secondary | ICD-10-CM | POA: Diagnosis not present

## 2022-10-02 DIAGNOSIS — F332 Major depressive disorder, recurrent severe without psychotic features: Secondary | ICD-10-CM | POA: Diagnosis not present

## 2022-10-02 DIAGNOSIS — F330 Major depressive disorder, recurrent, mild: Secondary | ICD-10-CM | POA: Diagnosis not present

## 2022-10-03 DIAGNOSIS — F332 Major depressive disorder, recurrent severe without psychotic features: Secondary | ICD-10-CM | POA: Diagnosis not present

## 2022-10-09 DIAGNOSIS — I10 Essential (primary) hypertension: Secondary | ICD-10-CM | POA: Diagnosis not present

## 2022-10-09 DIAGNOSIS — R000 Tachycardia, unspecified: Secondary | ICD-10-CM | POA: Diagnosis not present

## 2022-10-09 DIAGNOSIS — I493 Ventricular premature depolarization: Secondary | ICD-10-CM | POA: Diagnosis not present

## 2022-10-14 DIAGNOSIS — O139 Gestational [pregnancy-induced] hypertension without significant proteinuria, unspecified trimester: Secondary | ICD-10-CM | POA: Insufficient documentation

## 2022-10-14 DIAGNOSIS — F32A Depression, unspecified: Secondary | ICD-10-CM | POA: Insufficient documentation

## 2022-10-14 DIAGNOSIS — F419 Anxiety disorder, unspecified: Secondary | ICD-10-CM | POA: Insufficient documentation

## 2022-10-14 HISTORY — DX: Depression, unspecified: F32.A

## 2022-10-14 NOTE — Progress Notes (Unsigned)
Cardiology Office Note:    Date:  10/15/2022   ID:  Vanessa Mccarty, DOB 26-Aug-1986, MRN [id number]  PCP:  Clayborn Heron, MD  Cardiologist:  Norman Herrlich, MD   Referring MD: Clayborn Heron, MD  ASSESSMENT:    1. Sinus tachycardia   2. Adult ADHD   3. Elevated blood pressure reading   4. History of pregnancy induced hypertension   5. Family history of stroke   6. Lipid screening    PLAN:    In order of problems listed above:  There are 2 interwoven clinical problems the first is palpitation with rapid heart rate and the second is sinus tachycardia detected Apple watch and appropriate for activity.  I told her generally this is a response rather than a cause and there are 2 obvious drivers the first being Adderall therapy and the other is post-COVID symptoms she assures me in the last 6 months she had normal thyroid studies and a CBC was normal in the ER and she takes no over-the-counter drugs affecting her heart rhythm.  We discussed strategies for mitigating including regular physical activity and further evaluation echocardiogram and event monitor for structural heart disease and arrhythmia beyond sinus tachycardia.  She does not want to stop her Adderall. With hypertensive disorder pregnancy she is at risk for hypertension and other cardiovascular complications and I asked her with a valid data device and good technique to record trend and bring blood pressures to follow-up I do not think she requires antihypertensive agent if she did I would choose a rate slowing calcium channel blocker Check lipids with her family history of stroke screening including LP little way  Next appointment 6 weeks   Medication Adjustments/Labs and Tests Ordered: Current medicines are reviewed at length with the patient today.  Concerns regarding medicines are outlined above.  Orders Placed This Encounter  Procedures   Lipid panel   Lipoprotein A (LPA)   LONG TERM MONITOR (3-14 DAYS)    ECHOCARDIOGRAM COMPLETE   No orders of the defined types were placed in this encounter.    Chief complaint rapid heart rate detected by my Apple Watch  History of Present Illness:    Vanessa Mccarty is a 36 y.o. female with a history of ADD who is being seen today for the evaluation of symptomatic sinus tachycardia seen at [facility] ED 10/09/2022 at the request of Rankins, Fanny Dance, MD.  EKG 10/09/2022 at [facility] sinus tachycardia 117 BPM OW normal EKG  Is a very complex case involving both symptoms of palpitation as well as rapid heart rate detected with an Apple Watch. She went to the [facility] room 10/09/2022  because she received a heart rate alarm at 134 while sitting in the car driving. She has a long history of palpitation with rapid heartbeat and actually preceded her taking Adderall. She takes no over-the-counter proarrhythmic drugs She has no history of heart disease congenital rheumatic or documented arrhythmia She received 1 alert for atrial fibrillation I reviewed the EKG strips on her iPhone and it sinus rhythm and sinus tachycardia She also had COVID-19 in December 2021 with long symptoms of brain fog and at times this rapid heart rate can be part of long COVID For further evaluation longer I do an echocardiogram to assure there is no structural heart disease I do a 1 week monitor to assess for arrhythmia other than sinus tachycardia I have asked her to talk with her for driving doctor about Adderall and I  would not put her on a beta-blocker that would not do any good in terms of attention deficit Or other neuropsychiatric alert drug like removal does not cause rapid heart rate She will avoid over-the-counter proarrhythmic drugs Asked to get a hold and regular activity program that can reset sinus node function reduce resting heart rate and blunted heart rate response to activity. She does not want to stop taking Adderall Past Medical History:  Diagnosis Date   Anxiety     Depression    Depression 10/14/2022   Pregnancy induced hypertension     Past Surgical History:  Procedure Laterality Date   TYMPANOSTOMY TUBE PLACEMENT     tube fell out btwn 8-10 yrs and last one removed at 18yrs    Current Medications: Current Meds  Medication Sig   amphetamine-dextroamphetamine (ADDERALL XR) 30 MG 24 hr capsule Take 30 mg by mouth every morning.   ARIPiprazole (ABILIFY) 2 MG tablet Take 2 mg by mouth every morning.   clonazePAM (KLONOPIN) 0.5 MG tablet Take 0.5 mg by mouth as needed for anxiety.   etonogestrel-ethinyl estradiol (NUVARING) 0.12-0.015 MG/24HR vaginal ring Place 1 each vaginally every 28 (twenty-eight) days. Insert vaginally and leave in place for 3 consecutive weeks, then remove for 1 week.  May use continuously as needed.   Fluvoxamine Maleate 100 MG CP24 Take 100 mg by mouth daily.   lamoTRIgine (LAMICTAL) 200 MG tablet Take 200 mg by mouth daily.   metoprolol tartrate (LOPRESSOR) 25 MG tablet Take 25 mg by mouth 2 (two) times daily.   Multiple Vitamins-Minerals (MULTIVITAMIN ADULTS PO) Take by mouth daily.   traZODone (DESYREL) 50 MG tablet Take 50-100 mg by mouth at bedtime.     Allergies:   Sulfa antibiotics   Social History   Socioeconomic History   Marital status: Married    Spouse name: Not on file   Number of children: Not on file   Years of education: Not on file   Highest education level: Not on file  Occupational History   Not on file  Tobacco Use   Smoking status: Former    Packs/day: 0.25    Years: 10.00    Total pack years: 2.50    Types: Cigarettes    Quit date: 08/01/2010    Years since quitting: 12.2    Passive exposure: Past   Smokeless tobacco: Never  Vaping Use   Vaping Use: Never used  Substance and Sexual Activity   Alcohol use: Yes    Comment: 2 a week   Drug use: No   Sexual activity: Yes    Partners: Male    Birth control/protection: Inserts    Comment: HUSBAND WITH VASECTOMY, nuvaring, 1st  intercourse- 15, partners- 4  Other Topics Concern   Not on file  Social History Narrative   Not on file   Social Determinants of Health   Financial Resource Strain: Not on file  Food Insecurity: Not on file  Transportation Needs: Not on file  Physical Activity: Not on file  Stress: Not on file  Social Connections: Not on file     Family History: The patient's family history includes Breast cancer in her paternal aunt; Diabetes in her maternal grandfather; Hypertension in her maternal aunt, maternal uncle, and mother; Stroke in her maternal grandfather, maternal grandmother, and mother.  ROS:   ROS Please see the history of present illness.     All other systems reviewed and are negative.  EKGs/Labs/Other Studies Reviewed:    The following  studies were reviewed today:    Physical Exam:    VS:  BP 110/74 (BP Location: Left Arm, Patient Position: Sitting, Cuff Size: Normal)   Pulse (!) 104   Ht 5' 3.75" (1.619 m)   Wt 141 lb 9.6 oz (64.2 kg)   SpO2 99%   BMI 24.50 kg/m     Wt Readings from Last 3 Encounters:  10/15/22 141 lb 9.6 oz (64.2 kg)  02/28/22 138 lb (62.6 kg)  02/27/21 145 lb (65.8 kg)     GEN:  Well nourished, well developed in no acute distress HEENT: Normal NECK: No JVD; No carotid bruits LYMPHATICS: No lymphadenopathy CARDIAC: RRR, no murmurs, rubs, gallops RESPIRATORY:  Clear to auscultation without rales, wheezing or rhonchi  ABDOMEN: Soft, non-tender, non-distended MUSCULOSKELETAL:  No edema; No deformity  SKIN: Warm and dry NEUROLOGIC:  Alert and oriented x 3 PSYCHIATRIC:  Normal affect     Signed, Norman Herrlich, MD  10/15/2022 12:03 PM    [facility]

## 2022-10-15 ENCOUNTER — Ambulatory Visit: Payer: BC Managed Care – PPO | Attending: Cardiology | Admitting: Cardiology

## 2022-10-15 ENCOUNTER — Encounter: Payer: Self-pay | Admitting: Cardiology

## 2022-10-15 ENCOUNTER — Ambulatory Visit (INDEPENDENT_AMBULATORY_CARE_PROVIDER_SITE_OTHER): Payer: BC Managed Care – PPO

## 2022-10-15 VITALS — BP 110/74 | HR 104 | Ht 63.75 in | Wt 141.6 lb

## 2022-10-15 DIAGNOSIS — R000 Tachycardia, unspecified: Secondary | ICD-10-CM

## 2022-10-15 DIAGNOSIS — R030 Elevated blood-pressure reading, without diagnosis of hypertension: Secondary | ICD-10-CM

## 2022-10-15 DIAGNOSIS — F909 Attention-deficit hyperactivity disorder, unspecified type: Secondary | ICD-10-CM

## 2022-10-15 DIAGNOSIS — Z8759 Personal history of other complications of pregnancy, childbirth and the puerperium: Secondary | ICD-10-CM

## 2022-10-15 DIAGNOSIS — Z13220 Encounter for screening for lipoid disorders: Secondary | ICD-10-CM | POA: Diagnosis not present

## 2022-10-15 DIAGNOSIS — Z823 Family history of stroke: Secondary | ICD-10-CM

## 2022-10-15 NOTE — Patient Instructions (Addendum)
Medication Instructions:  Your physician recommends that you continue on your current medications as directed. Please refer to the Current Medication list given to you today.  *If you need a refill on your cardiac medications before your next appointment, please call your pharmacy*   Lab Work: Your physician recommends that you return for lab work in: Today for Lipid Panel & LPa  If you have labs (blood work) drawn today and your tests are completely normal, you will receive your results only by: MyChart Message (if you have MyChart) OR A paper copy in the mail If you have any lab test that is abnormal or we need to change your treatment, we will call you to review the results.   Testing/Procedures: Your physician has requested that you have an echocardiogram. Echocardiography is a painless test that uses sound waves to create images of your heart. It provides your doctor with information about the size and shape of your heart and how well your heart's chambers and valves are working. This procedure takes approximately one hour. There are no restrictions for this procedure. Please do NOT wear cologne, perfume, aftershave, or lotions (deodorant is allowed). Please arrive 15 minutes prior to your appointment time.   You have been asked to wear a Zio Heart Monitor today. It is to be worn for 7 days. Please remove the monitor on Nov. 22nd and mail back in the box provided.  If you have any questions about the monitor please call the company at [phone]     Follow-Up: At [facility], you and your health needs are our priority.  As part of our continuing mission to provide you with exceptional heart care, we have created designated Provider Care Teams.  These Care Teams include your primary Cardiologist (physician) and Advanced Practice Providers (APPs -  Physician Assistants and Nurse Practitioners) who all work together to provide you with the care you need, when you need  it.  We recommend signing up for the patient portal called "MyChart".  Sign up information is provided on this After Visit Summary.  MyChart is used to connect with patients for Virtual Visits (Telemedicine).  Patients are able to view lab/test results, encounter notes, upcoming appointments, etc.  Non-urgent messages can be sent to your provider as well.   To learn more about what you can do with MyChart, go to [email or web address].    Your next appointment:   8 week(s)  The format for your next appointment:   In Person  Provider:   Norman Herrlich, MD    Other Instructions Sign up for MyChart Check and Record BP's daily Activity Goal: 8500 Steps daily  Important Information About Sugar      1. Avoid all over-the-counter antihistamines except Claritin/Loratadine and Zyrtec/Cetrizine. 2. Avoid all combination including cold sinus allergies flu decongestant and sleep medications 3. You can use Robitussin DM Mucinex and Mucinex DM for cough. 4. can use Tylenol aspirin ibuprofen and naproxen but no combinations such as sleep or sinus.   [employer]  Tips to measure your blood pressure correctly  To determine whether you have hypertension, a medical professional will take a blood pressure reading. How you prepare for the test, the position of your arm, and other factors can change a blood pressure reading by 10% or more. That could be enough to hide high blood pressure, start you on a drug you don't really need, or lead your doctor to incorrectly adjust your medications. National and international guidelines offer specific instructions for  measuring blood pressure. If a doctor, nurse, or medical assistant isn't doing it right, don't hesitate to ask him or her to get with the guidelines. Here's what you can do to ensure a correct reading:  Don't drink a caffeinated beverage or smoke during the 30 minutes before the test.  Sit quietly for five minutes before the test begins.  During the  measurement, sit in a chair with your feet on the floor and your arm supported so your elbow is at about heart level.  The inflatable part of the cuff should completely cover at least 80% of your upper arm, and the cuff should be placed on bare skin, not over a shirt.  Don't talk during the measurement.  Have your blood pressure measured twice, with a brief break in between. If the readings are different by 5 points or more, have it done a third time. There are times to break these rules. If you sometimes feel lightheaded when getting out of bed in the morning or when you stand after sitting, you should have your blood pressure checked while seated and then while standing to see if it falls from one position to the next. Because blood pressure varies throughout the day, your doctor will rarely diagnose hypertension on the basis of a single reading. Instead, he or she will want to confirm the measurements on at least two occasions, usually within a few weeks of one another. The exception to this rule is if you have a blood pressure reading of 180/110 mm Hg or higher. A result this high usually calls for prompt treatment. It's also a good idea to have your blood pressure measured in both arms at least once, since the reading in one arm (usually the right) may be higher than that in the left. A 2014 study in The American Journal of Medicine of nearly 3,400 people found average arm- to-arm differences in systolic blood pressure of about 5 points. The higher number should be used to make treatment decisions. In 2017, new guidelines from the American Heart Association, the [employer] of Cardiology, and nine other health organizations lowered the diagnosis of high blood pressure to 130/80 mm Hg or higher for all adults. The guidelines also redefined the various blood pressure categories to now include normal, elevated, Stage 1 hypertension, Stage 2 hypertension, and hypertensive crisis (see "Blood pressure  categories"). Blood pressure categories  Blood pressure category SYSTOLIC (upper number)  DIASTOLIC (lower number)  Normal Less than 120 mm Hg and Less than 80 mm Hg  Elevated 120-129 mm Hg and Less than 80 mm Hg  High blood pressure: Stage 1 hypertension 130-139 mm Hg or 80-89 mm Hg  High blood pressure: Stage 2 hypertension 140 mm Hg or higher or 90 mm Hg or higher  Hypertensive crisis (consult your doctor immediately) Higher than 180 mm Hg and/or Higher than 120 mm Hg  Source: American Heart Association and American Stroke Association. For more on getting your blood pressure under control, buy Controlling Your Blood Pressure, a Special Health Report from [facility].

## 2022-10-16 DIAGNOSIS — F330 Major depressive disorder, recurrent, mild: Secondary | ICD-10-CM | POA: Diagnosis not present

## 2022-10-16 LAB — LIPOPROTEIN A (LPA): Lipoprotein (a): 19.5 nmol/L (ref <75.0)

## 2022-10-16 LAB — LIPID PANEL
Chol/HDL Ratio: 3.5 ratio (ref 0.0–4.4)
Cholesterol, Total: 219 mg/dL — ABNORMAL HIGH (ref 100–199)
HDL: 62 mg/dL (ref >39)
LDL Chol Calc (NIH): 123 mg/dL — ABNORMAL HIGH (ref 0–99)
Triglycerides: 193 mg/dL — ABNORMAL HIGH (ref 0–149)
VLDL Cholesterol Cal: 34 mg/dL (ref 5–40)

## 2022-10-28 DIAGNOSIS — F330 Major depressive disorder, recurrent, mild: Secondary | ICD-10-CM | POA: Diagnosis not present

## 2022-10-30 ENCOUNTER — Ambulatory Visit: Payer: BC Managed Care – PPO | Attending: Cardiology

## 2022-10-30 DIAGNOSIS — Z13220 Encounter for screening for lipoid disorders: Secondary | ICD-10-CM

## 2022-10-30 DIAGNOSIS — F909 Attention-deficit hyperactivity disorder, unspecified type: Secondary | ICD-10-CM

## 2022-10-30 DIAGNOSIS — Z8759 Personal history of other complications of pregnancy, childbirth and the puerperium: Secondary | ICD-10-CM

## 2022-10-30 DIAGNOSIS — R030 Elevated blood-pressure reading, without diagnosis of hypertension: Secondary | ICD-10-CM | POA: Diagnosis not present

## 2022-10-30 DIAGNOSIS — R000 Tachycardia, unspecified: Secondary | ICD-10-CM

## 2022-10-30 DIAGNOSIS — Z823 Family history of stroke: Secondary | ICD-10-CM

## 2022-10-31 LAB — ECHOCARDIOGRAM COMPLETE
Area-P 1/2: 3.21 cm2
S' Lateral: 2.1 cm

## 2022-11-05 DIAGNOSIS — F332 Major depressive disorder, recurrent severe without psychotic features: Secondary | ICD-10-CM | POA: Diagnosis not present

## 2022-11-11 DIAGNOSIS — F330 Major depressive disorder, recurrent, mild: Secondary | ICD-10-CM | POA: Diagnosis not present

## 2022-11-21 ENCOUNTER — Ambulatory Visit: Payer: BC Managed Care – PPO | Admitting: Cardiology

## 2022-11-21 DIAGNOSIS — F330 Major depressive disorder, recurrent, mild: Secondary | ICD-10-CM | POA: Diagnosis not present

## 2022-12-05 DIAGNOSIS — F330 Major depressive disorder, recurrent, mild: Secondary | ICD-10-CM | POA: Diagnosis not present

## 2023-01-06 DIAGNOSIS — F4312 Post-traumatic stress disorder, chronic: Secondary | ICD-10-CM | POA: Diagnosis not present

## 2023-01-06 DIAGNOSIS — F411 Generalized anxiety disorder: Secondary | ICD-10-CM | POA: Diagnosis not present

## 2023-01-06 DIAGNOSIS — F410 Panic disorder [episodic paroxysmal anxiety] without agoraphobia: Secondary | ICD-10-CM | POA: Diagnosis not present

## 2023-01-06 DIAGNOSIS — F331 Major depressive disorder, recurrent, moderate: Secondary | ICD-10-CM | POA: Diagnosis not present

## 2023-01-15 DIAGNOSIS — Z0000 Encounter for general adult medical examination without abnormal findings: Secondary | ICD-10-CM | POA: Diagnosis not present

## 2023-02-03 DIAGNOSIS — F330 Major depressive disorder, recurrent, mild: Secondary | ICD-10-CM | POA: Diagnosis not present

## 2023-03-05 ENCOUNTER — Encounter: Payer: Self-pay | Admitting: Obstetrics & Gynecology

## 2023-03-05 ENCOUNTER — Ambulatory Visit (INDEPENDENT_AMBULATORY_CARE_PROVIDER_SITE_OTHER): Payer: BC Managed Care – PPO | Admitting: Obstetrics & Gynecology

## 2023-03-05 VITALS — BP 120/70 | HR 94 | Ht 63.75 in | Wt 144.0 lb

## 2023-03-05 DIAGNOSIS — Z01419 Encounter for gynecological examination (general) (routine) without abnormal findings: Secondary | ICD-10-CM

## 2023-03-05 DIAGNOSIS — N920 Excessive and frequent menstruation with regular cycle: Secondary | ICD-10-CM | POA: Diagnosis not present

## 2023-03-05 DIAGNOSIS — Z9189 Other specified personal risk factors, not elsewhere classified: Secondary | ICD-10-CM

## 2023-03-05 DIAGNOSIS — F330 Major depressive disorder, recurrent, mild: Secondary | ICD-10-CM | POA: Diagnosis not present

## 2023-03-05 MED ORDER — ETONOGESTREL-ETHINYL ESTRADIOL 0.12-0.015 MG/24HR VA RING: 1.0000 | VAGINAL_RING | VAGINAL | 4 refills | Status: DC

## 2023-03-05 NOTE — Progress Notes (Signed)
Vanessa Mccarty [id number]   History:    37 y.o. G3P2A1L2 Married.  Vasectomy.  Daughter 56 yo, son 51 yo.   RP:  Established patient presenting for annual gyn exam    HPI: Well on Nuvaring x 07/2021 with light menstrual flow.  Pelvic US was normal 07/2021. No breakthrough bleeding.  No pelvic pain.  Normal secretions.  No pain with intercourse. Pap 01/2021 Neg. Will repeat at 2 yrs. Vasectomy.  Urine and bowel movements normal. Breasts normal. No first degree relative with Breast Ca. Body mass index 24.91.  Good fitness. Nutrition no veggies or fruits but takes a multivitamin.  Past medical history,surgical history, family history and social history were all reviewed and documented in the EPIC chart.  Gynecologic History No LMP recorded. (Menstrual status: Other).  Obstetric History OB History  Gravida Para Term Preterm AB Living  3 2 2  0 1 2  SAB IAB Ectopic Multiple Live Births  1 0 0 0 2    # Outcome Date GA Lbr Len/2nd Weight Sex Delivery Anes PTL Lv  3 Term 07/10/11 [redacted]w[redacted]d 04:50 / 00:12 8 lb 12.4 oz (3.98 kg) M Vag-Spont EPI  LIV     Birth Comments: wnl  2 SAB           1 Term              ROS: A ROS was performed and pertinent positives and negatives are included in the history. GENERAL: No fevers or chills. HEENT: No change in vision, no earache, sore throat or sinus congestion. NECK: No pain or stiffness. CARDIOVASCULAR: No chest pain or pressure. No palpitations. PULMONARY: No shortness of breath, cough or wheeze. GASTROINTESTINAL: No abdominal pain, nausea, vomiting or diarrhea, melena or bright red blood per rectum. GENITOURINARY: No urinary frequency, urgency, hesitancy or dysuria. MUSCULOSKELETAL: No joint or muscle pain, no back pain, no recent trauma. DERMATOLOGIC: No rash, no itching, no lesions. ENDOCRINE: No polyuria, polydipsia, no heat or cold intolerance. No recent change in weight. HEMATOLOGICAL: No anemia or easy bruising or bleeding. NEUROLOGIC: No  headache, seizures, numbness, tingling or weakness. PSYCHIATRIC: No depression, no loss of interest in normal activity or change in sleep pattern.     Exam:   BP 120/70   Pulse 94   Ht 5' 3.75" (1.619 m)   Wt 144 lb (65.3 kg)   SpO2 99%   BMI 24.91 kg/m   Body mass index is 24.91 kg/m.  General appearance : Well developed well nourished female. No acute distress HEENT: Eyes: no retinal hemorrhage or exudates,  Neck supple, trachea midline, no carotid bruits, no thyroidmegaly Lungs: Clear to auscultation, no rhonchi or wheezes, or rib retractions  Heart: Regular rate and rhythm, no murmurs or gallops Breast:Examined in sitting and supine position were symmetrical in appearance, no palpable masses or tenderness,  no skin retraction, no nipple inversion, no nipple discharge, no skin discoloration, no axillary or supraclavicular lymphadenopathy Abdomen: no palpable masses or tenderness, no rebound or guarding Extremities: no edema or skin discoloration or tenderness  Pelvic: Vulva: Normal             Vagina: No gross lesions or discharge  Cervix: No gross lesions or discharge  Uterus  AV, normal size, shape and consistency, non-tender and mobile  Adnexa  Without masses or tenderness  Anus: Normal   Assessment/Plan:  37 y.o. female for annual exam   1. Well female exam with routine gynecological exam Well on Nuvaring x  07/2021 with light menstrual flow.  Pelvic US was normal 07/2021. No breakthrough bleeding.  No pelvic pain.  Normal secretions.  No pain with intercourse. Pap 01/2021 Neg. Will repeat at 2 yrs. Vasectomy.  Urine and bowel movements normal. Breasts normal. No first degree relative with Breast Ca. Body mass index 24.91.  Good fitness. Nutrition no veggies or fruits but takes a multivitamin.  2. Relies on partner vasectomy for contraception  3. Menorrhagia with regular cycle Well on Nuvaring x 07/2021 with light menstrual flow.  Pelvic US was normal 07/2021. No  breakthrough bleeding.  No pelvic pain.  Normal secretions.  No pain with intercourse. No CI to continue on Nuvaring.  Prescription sent to pharmacy.  Other orders - QUEtiapine (SEROQUEL) 25 MG tablet; Take by mouth. - amphetamine-dextroamphetamine (ADDERALL XR) 30 MG 24 hr capsule; Take 1 capsule by mouth every morning. - BIOTIN PO; Take by mouth. - etonogestrel-ethinyl estradiol (NUVARING) 0.12-0.015 MG/24HR vaginal ring; Place 1 each vaginally every 28 (twenty-eight) days. Insert vaginally and leave in place for 3 consecutive weeks, then remove for 1 week.  May use continuously as needed.   Princess Bruins MD, 9:17 AM

## 2023-03-20 DIAGNOSIS — F330 Major depressive disorder, recurrent, mild: Secondary | ICD-10-CM | POA: Diagnosis not present

## 2023-04-09 DIAGNOSIS — F330 Major depressive disorder, recurrent, mild: Secondary | ICD-10-CM | POA: Diagnosis not present

## 2023-05-01 DIAGNOSIS — F330 Major depressive disorder, recurrent, mild: Secondary | ICD-10-CM | POA: Diagnosis not present

## 2023-05-28 DIAGNOSIS — F319 Bipolar disorder, unspecified: Secondary | ICD-10-CM | POA: Diagnosis not present

## 2023-05-28 DIAGNOSIS — R5383 Other fatigue: Secondary | ICD-10-CM | POA: Diagnosis not present

## 2023-05-28 DIAGNOSIS — M2550 Pain in unspecified joint: Secondary | ICD-10-CM | POA: Diagnosis not present

## 2023-05-28 DIAGNOSIS — R52 Pain, unspecified: Secondary | ICD-10-CM | POA: Diagnosis not present

## 2023-07-07 DIAGNOSIS — F4312 Post-traumatic stress disorder, chronic: Secondary | ICD-10-CM | POA: Diagnosis not present

## 2023-07-07 DIAGNOSIS — F411 Generalized anxiety disorder: Secondary | ICD-10-CM | POA: Diagnosis not present

## 2023-07-07 DIAGNOSIS — F410 Panic disorder [episodic paroxysmal anxiety] without agoraphobia: Secondary | ICD-10-CM | POA: Diagnosis not present

## 2023-07-07 DIAGNOSIS — F331 Major depressive disorder, recurrent, moderate: Secondary | ICD-10-CM | POA: Diagnosis not present

## 2023-07-07 DIAGNOSIS — F902 Attention-deficit hyperactivity disorder, combined type: Secondary | ICD-10-CM | POA: Diagnosis not present

## 2023-07-07 DIAGNOSIS — Z5181 Encounter for therapeutic drug level monitoring: Secondary | ICD-10-CM | POA: Diagnosis not present

## 2023-10-06 DIAGNOSIS — D235 Other benign neoplasm of skin of trunk: Secondary | ICD-10-CM | POA: Diagnosis not present

## 2023-10-06 DIAGNOSIS — D1801 Hemangioma of skin and subcutaneous tissue: Secondary | ICD-10-CM | POA: Diagnosis not present

## 2023-10-06 DIAGNOSIS — L814 Other melanin hyperpigmentation: Secondary | ICD-10-CM | POA: Diagnosis not present

## 2023-10-20 DIAGNOSIS — R2230 Localized swelling, mass and lump, unspecified upper limb: Secondary | ICD-10-CM | POA: Diagnosis not present

## 2023-10-20 DIAGNOSIS — Z6824 Body mass index (BMI) 24.0-24.9, adult: Secondary | ICD-10-CM | POA: Diagnosis not present

## 2023-10-20 DIAGNOSIS — T148XXA Other injury of unspecified body region, initial encounter: Secondary | ICD-10-CM | POA: Diagnosis not present

## 2023-11-16 DIAGNOSIS — R21 Rash and other nonspecific skin eruption: Secondary | ICD-10-CM | POA: Diagnosis not present

## 2023-11-16 DIAGNOSIS — L255 Unspecified contact dermatitis due to plants, except food: Secondary | ICD-10-CM | POA: Diagnosis not present

## 2024-04-12 ENCOUNTER — Other Ambulatory Visit: Payer: Self-pay

## 2024-04-12 DIAGNOSIS — N920 Excessive and frequent menstruation with regular cycle: Secondary | ICD-10-CM

## 2024-04-12 MED ORDER — ETONOGESTREL-ETHINYL ESTRADIOL 0.12-0.015 MG/24HR VA RING: 1.0000 | VAGINAL_RING | VAGINAL | 0 refills | Status: AC

## 2024-04-12 NOTE — Telephone Encounter (Signed)
 Med refill request: generic Nuvaring Last AEX: 03/05/23 Dr. Lavoie Next AEX: none scheduled Last MMG (if hormonal med) n/a Refill authorized: generic Nuvaring, needs appointment for further refills. Please approve or deny as appropriate.
